# Patient Record
Sex: Male | Born: 1970 | Race: Black or African American | Hispanic: No | Marital: Married | Smoking: Never smoker
Health system: Southern US, Community
[De-identification: ages and names within clinical notes are randomized; demographics above are authoritative.]

## PROBLEM LIST (undated history)

## (undated) HISTORY — PX: KNEE SURGERY: SHX244

---

## 2008-08-23 ENCOUNTER — Emergency Department: Payer: Self-pay | Admitting: Emergency Medicine

## 2013-09-18 ENCOUNTER — Emergency Department: Payer: Self-pay | Admitting: Emergency Medicine

## 2013-09-18 LAB — CBC WITH DIFFERENTIAL/PLATELET
BASOS PCT: 0.4 %
Basophil #: 0 10*3/uL (ref 0.0–0.1)
Eosinophil #: 0.2 10*3/uL (ref 0.0–0.7)
Eosinophil %: 1.7 %
HCT: 43.4 % (ref 40.0–52.0)
HGB: 14.1 g/dL (ref 13.0–18.0)
Lymphocyte #: 2.8 10*3/uL (ref 1.0–3.6)
Lymphocyte %: 21.7 %
MCH: 28 pg (ref 26.0–34.0)
MCHC: 32.4 g/dL (ref 32.0–36.0)
MCV: 86 fL (ref 80–100)
MONO ABS: 0.7 x10 3/mm (ref 0.2–1.0)
Monocyte %: 5.2 %
NEUTROS ABS: 9.2 10*3/uL — AB (ref 1.4–6.5)
Neutrophil %: 71 %
Platelet: 211 10*3/uL (ref 150–440)
RBC: 5.04 10*6/uL (ref 4.40–5.90)
RDW: 14.2 % (ref 11.5–14.5)
WBC: 13 10*3/uL — AB (ref 3.8–10.6)

## 2013-09-18 LAB — COMPREHENSIVE METABOLIC PANEL
ALBUMIN: 3.8 g/dL (ref 3.4–5.0)
ALT: 33 U/L (ref 12–78)
ANION GAP: 9 (ref 7–16)
AST: 22 U/L (ref 15–37)
Alkaline Phosphatase: 65 U/L
BUN: 17 mg/dL (ref 7–18)
Bilirubin,Total: 0.3 mg/dL (ref 0.2–1.0)
CALCIUM: 8.9 mg/dL (ref 8.5–10.1)
Chloride: 105 mmol/L (ref 98–107)
Co2: 24 mmol/L (ref 21–32)
Creatinine: 1.27 mg/dL (ref 0.60–1.30)
GLUCOSE: 141 mg/dL — AB (ref 65–99)
Osmolality: 280 (ref 275–301)
Potassium: 3.9 mmol/L (ref 3.5–5.1)
Sodium: 138 mmol/L (ref 136–145)
TOTAL PROTEIN: 8.1 g/dL (ref 6.4–8.2)

## 2013-09-18 LAB — LIPASE, BLOOD: LIPASE: 237 U/L (ref 73–393)

## 2013-09-19 LAB — URINALYSIS, COMPLETE
Bilirubin,UR: NEGATIVE
Blood: NEGATIVE
GLUCOSE, UR: NEGATIVE mg/dL (ref 0–75)
KETONE: NEGATIVE
Leukocyte Esterase: NEGATIVE
Nitrite: NEGATIVE
Ph: 7 (ref 4.5–8.0)
Protein: NEGATIVE
RBC,UR: 3 /HPF (ref 0–5)
Specific Gravity: 1.024 (ref 1.003–1.030)

## 2014-12-07 IMAGING — CT CT STONE STUDY
2 of 4 series · 15 of 46 positions shown, 17 images · non-contrast
Comparison: None.

CLINICAL DATA: Severe left lower quadrant abdominal pain, radiating
to the left side of the back.

EXAM:
CT ABDOMEN AND PELVIS WITHOUT CONTRAST
TECHNIQUE: Multidetector CT imaging of the abdomen and pelvis was performed
following the standard protocol without IV contrast.

[Series 2: stone standard full · axial · 0.80mm/px · z∈[-412,+58]mm · 12 of 104 slices shown, 14 images]
[im 5/104  soft-tissue]
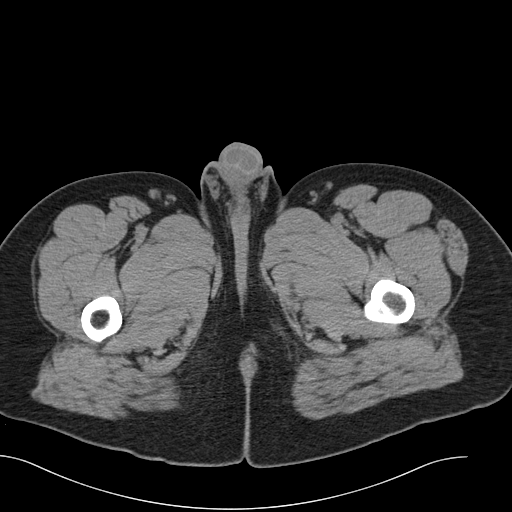
[im 5/104  bone]
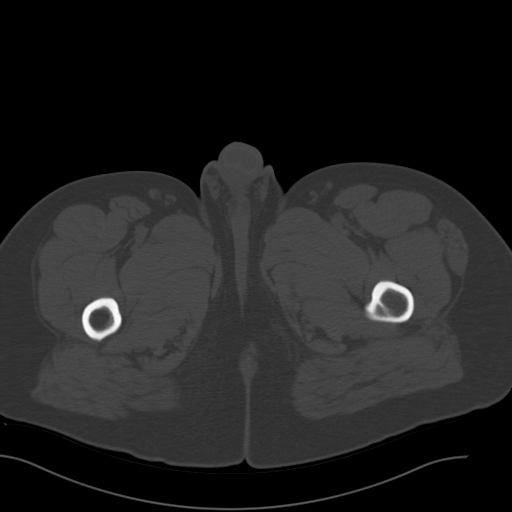
[im 13/104  soft-tissue]
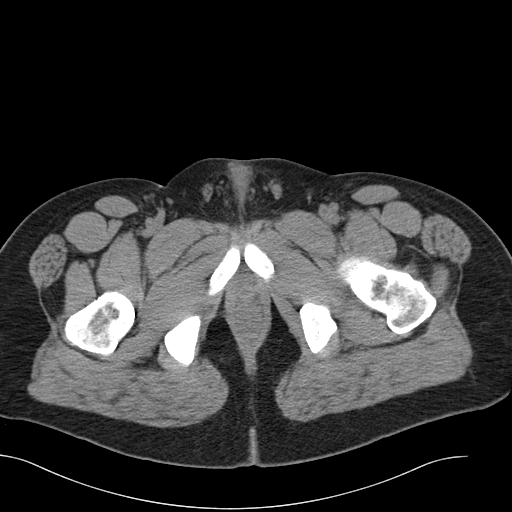
[im 22/104  soft-tissue]
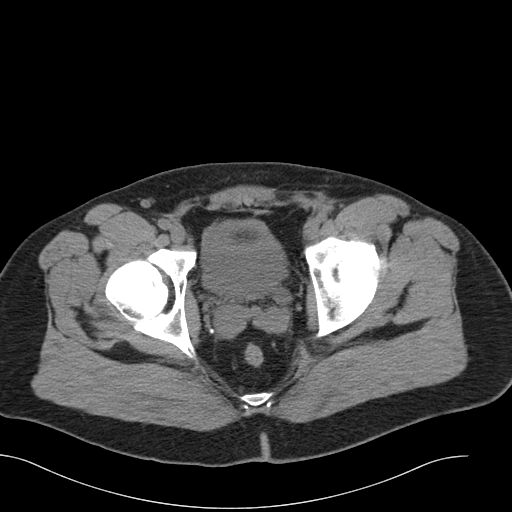
[im 31/104  soft-tissue]
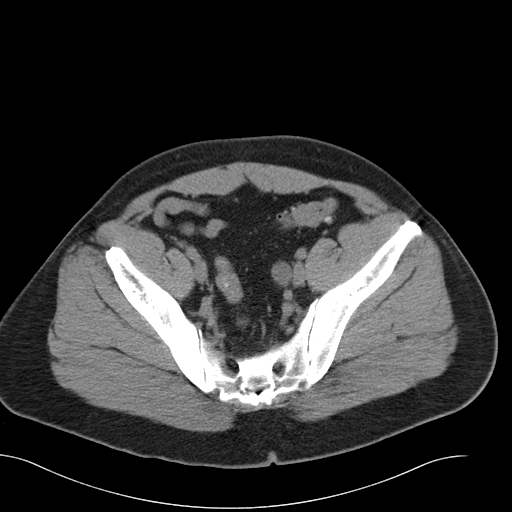
[im 39/104  soft-tissue]
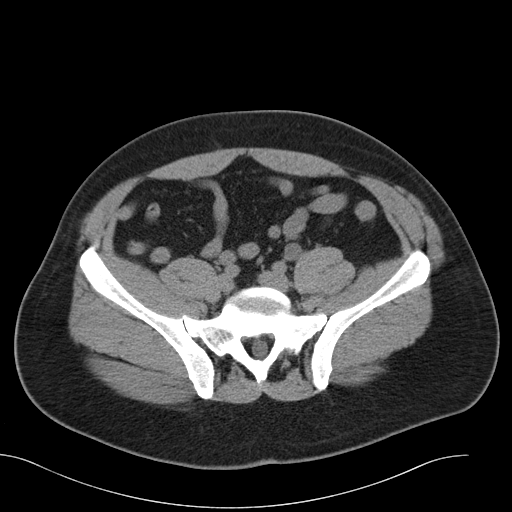
[im 48/104  soft-tissue]
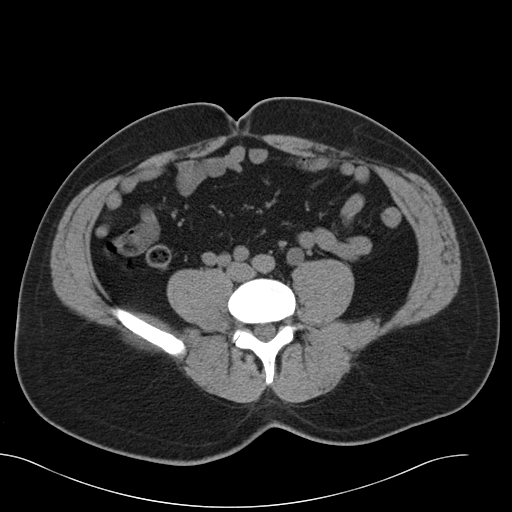
[im 56/104  soft-tissue]
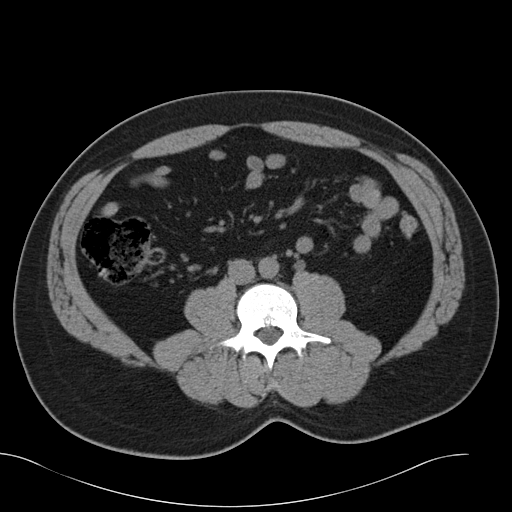
[im 65/104  soft-tissue]
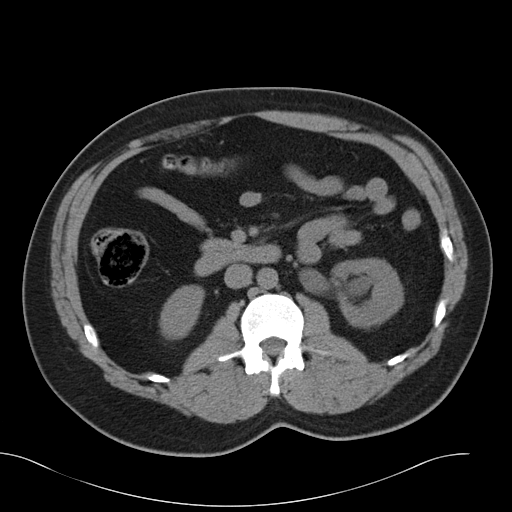
[im 73/104  soft-tissue]
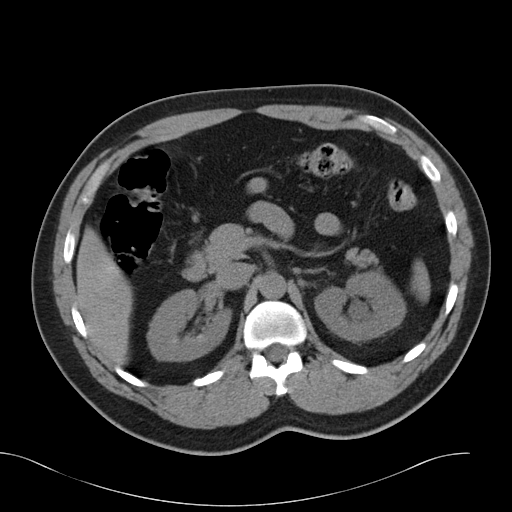
[im 73/104  bone]
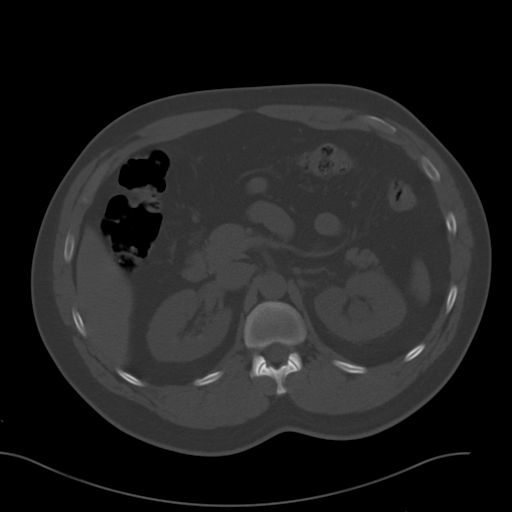
[im 82/104  soft-tissue]
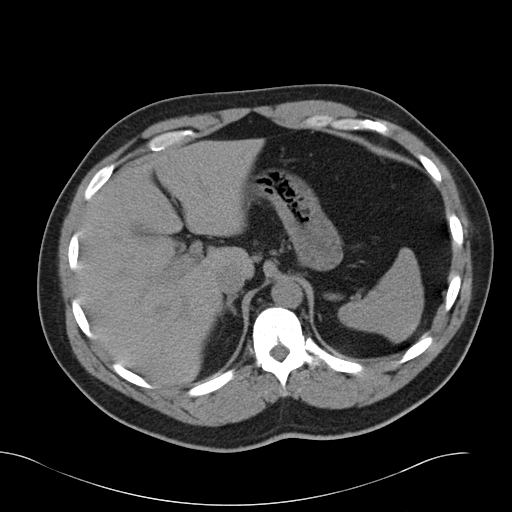
[im 91/104  soft-tissue]
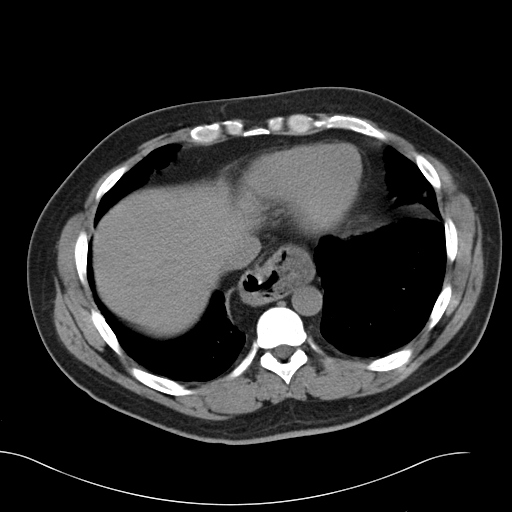
[im 99/104  soft-tissue]
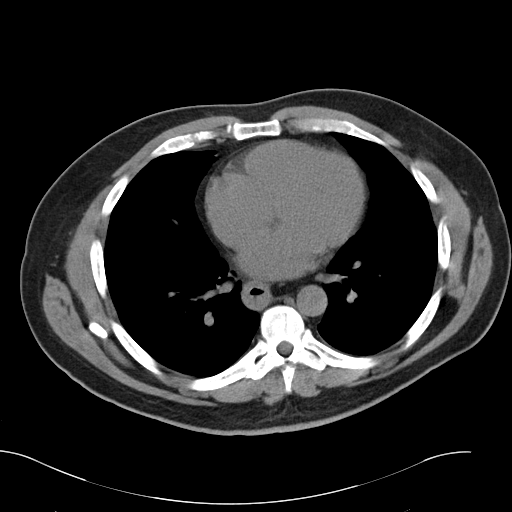

[Series 5: cor stone standard full · coronal · 0.77mm/px · 3 of 134 slices shown]
[im 45/134  soft-tissue]
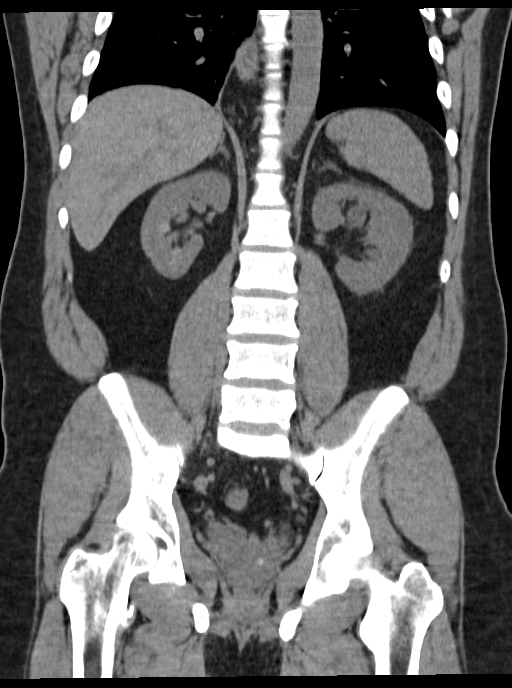
[im 60/134  soft-tissue]
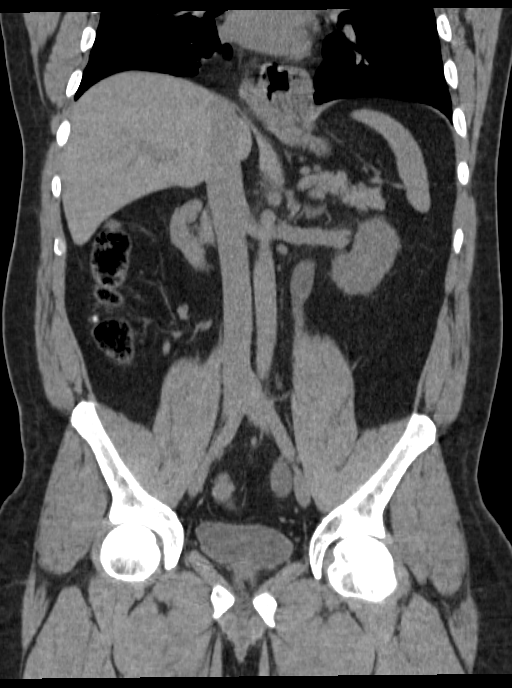
[im 74/134  soft-tissue]
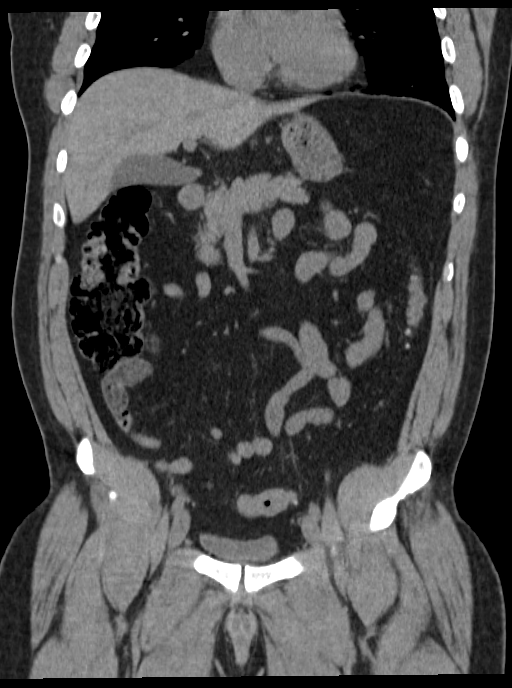

[15 of 46 positions shown; findings below may reference images not displayed]

FINDINGS: The visualized lung bases are clear. Thickening of the distal
esophagus may reflect chronic sequelae of inflammatory change or
possibly mild esophagitis.

The liver and spleen are unremarkable in appearance. The gallbladder
is within normal limits. The pancreas and adrenal glands are
unremarkable.

There is moderate left-sided hydronephrosis, with diffuse prominence
of the left ureter. However, no distal obstructing stone is
identified. This may reflect a recently passed stone, not visualized
on this study.

Minimal calcification is noted at the right renal calyces, without a
defined stone. The right kidney is otherwise unremarkable. Mild
perinephric stranding is noted on the left side.

No free fluid is identified. The small bowel is unremarkable in
appearance. A small to moderate hiatal hernia is seen. The stomach
is otherwise unremarkable in appearance. No acute vascular
abnormalities are seen.

The appendix distended state and grossly unremarkable in appearance,
without evidence for appendicitis. Scattered diverticulosis is noted
along the descending and proximal sigmoid colon, without evidence of
diverticulitis.

The bladder is largely decompressed and grossly unremarkable. A
small urachal remnant is incidentally noted. Postoperative change is
noted at the left inguinal region, reflecting prior inguinal hernia
repair. No inguinal lymphadenopathy is seen.

No acute osseous abnormalities are identified.
IMPRESSION: 1. Moderate left-sided hydronephrosis, with diffuse left ureteral
prominence. However, no distal obstructing stone is seen. This may
reflect a recently passed stone, not visualized on this study.
2. Small to moderate hiatal hernia seen.
3. Thickening of the distal esophagus may reflect chronic sequelae
of inflammatory change or possibly mild esophagitis.
4. Scattered diverticulosis along the descending and proximal
sigmoid colon, without evidence of diverticulitis.

## 2015-06-20 ENCOUNTER — Encounter: Payer: Self-pay | Admitting: Emergency Medicine

## 2015-06-20 ENCOUNTER — Emergency Department
Admission: EM | Admit: 2015-06-20 | Discharge: 2015-06-20 | Disposition: A | Discharge status: Home / Self Care | Payer: Self-pay | Attending: Emergency Medicine | Admitting: Emergency Medicine

## 2015-06-20 DIAGNOSIS — R21 Rash and other nonspecific skin eruption: Secondary | ICD-10-CM | POA: Insufficient documentation

## 2015-06-20 MED ORDER — PREDNISONE 10 MG (21) PO TBPK: ORAL_TABLET | ORAL | Status: DC

## 2015-06-20 MED ORDER — METHYLPREDNISOLONE SODIUM SUCC 125 MG IJ SOLR
125.0000 mg | Freq: Once | INTRAMUSCULAR | Status: AC
  Administered 2015-06-20: 125 mg via INTRAMUSCULAR
  Filled 2015-06-20: qty 2

## 2015-06-20 MED ORDER — PERMETHRIN 5 % EX CREA: 1.0000 "application " | TOPICAL_CREAM | Freq: Once | CUTANEOUS | Status: DC

## 2015-06-20 NOTE — ED Provider Notes (Signed)
[facility] Emergency Department Provider Note     Time seen: ----------------------------------------- 10:07 PM on 06/20/2015 -----------------------------------------    I have reviewed the triage vital signs and the nursing notes.   HISTORY  Chief Complaint No chief complaint on file.    HPI Edwin Vincent is a 45 y.o. male who presents to ER for intense pruritic rash over his trunk and extremities for the last month. Patient denies any contact with anyone else that has had a rash although he works as a Paediatric nursebarber, denies any fevers chills or other complaints.   No past medical history on file.  There are no active problems to display for this patient.   No past surgical history on file.  Allergies Review of patient's allergies indicates not on file.  Social History Social History  Substance Use Topics  . Smoking status: Not on file  . Smokeless tobacco: Not on file  . Alcohol Use: Not on file    Review of Systems Constitutional: Negative for fever. Skin: Positive for rash Neurological: Negative for headaches, focal weakness or numbness.  10-point ROS otherwise negative.  ____________________________________________   PHYSICAL EXAM:  VITAL SIGNS: ED Triage Vitals  Enc Vitals Group     BP 06/20/15 2156 125/80 mmHg     Pulse Rate 06/20/15 2156 71     Resp 06/20/15 2156 18     Temp 06/20/15 2156 98.6 F (37 C)     Temp Source 06/20/15 2156 Oral     SpO2 06/20/15 2156 98 %     Weight --      Height 06/20/15 2156 5\' 8"  (1.727 m)     Head Cir --      Peak Flow --      Pain Score --      Pain Loc --      Pain Edu? --      Excl. in GC? --     Constitutional: Alert and oriented. Well appearing and in no distress. Eyes: Conjunctivae are normal. PERRL. Normal extraocular movements. ENT   Head: Normocephalic and atraumatic.   Nose: No congestion/rhinnorhea.   Mouth/Throat: Mucous membranes are moist.   Neck: No  stridor. Musculoskeletal: Nontender with normal range of motion in all extremities. Multiple areas of rash with excoriation as dictated below Neurologic:  Normal speech and language. No gross focal neurologic deficits are appreciated.  Skin:  Extensive papular lesions, some of which are grouped mostly over the upper and lower extremities, some on the trunk as well. Multiple areas of excoriation, rash likely consistent with scabies. Psychiatric: Mood and affect are normal. Speech and behavior are normal.  ___________________________________________  ED COURSE:  Pertinent labs & imaging results that were available during my care of the patient were reviewed by me and considered in my medical decision making (see chart for details). Patient is in no acute distress, rash likely consistent with scabies. He has received IM Solu-Medrol. ___________________________________________  FINAL ASSESSMENT AND PLAN  Skin rash  Plan: Patient with an intense pruritic rash of uncertain etiology. I placed him on Solu-Medrol to try to suppress his dermatitis, he'll be on Elamite cream and is encouraged to follow up with dermatology in a week if not improved.  Emily FilbertWilliams, Jonathan E, MD   Emily FilbertJonathan E Williams, MD 06/20/15 [phone]

## 2015-06-20 NOTE — ED Notes (Signed)
Pt c/o rash all over body x1 month ago. Pt reports itching and redness to the areas.

## 2015-06-20 NOTE — Discharge Instructions (Signed)
Scabies, Adult Scabies is a skin condition that happens when very small insects get under the skin (infestation). This causes a rash and severe itchiness. Scabies can spread from person to person (is contagious). If you get scabies, it is common for others in your household to get scabies too. With proper treatment, symptoms usually go away in 2-4 weeks. Scabies usually does not cause lasting problems. CAUSES This condition is caused by mites (Sarcoptes scabiei, or human itch mites) that can only be seen with a microscope. The mites get into the top layer of skin and lay eggs. Scabies can spread from person to person through:  Close contact with a person who has scabies.  Contact with infested items, such as towels, bedding, or clothing. RISK FACTORS This condition is more likely to develop in:  People who live in nursing homes and other extended-care facilities.  People who have sexual contact with a partner who has scabies.  Young children who attend child care facilities.  People who care for others who are at increased risk for scabies. SYMPTOMS Symptoms of this condition may include:  Severe itchiness. This is often worse at night.  A rash that includes tiny red bumps or blisters. The rash commonly occurs on the wrist, elbow, armpit, fingers, waist, groin, or buttocks. Bumps may form a line (burrow) in some areas.  Skin irritation. This can include scaly patches or sores. DIAGNOSIS This condition is diagnosed with a physical exam. Your health care provider will look closely at your skin. In some cases, your health care provider may take a sample of your affected skin (skin scraping) and have it examined under a microscope. TREATMENT This condition may be treated with:  Medicated cream or lotion that kills the mites. This is spread on the entire body and left on for several hours. Usually, one treatment with medicated cream or lotion is enough to kill all of the mites. In severe  cases, the treatment may be repeated.  Medicated cream that relieves itching.  Medicines that help to relieve itching.  Medicines that kill the mites. This treatment is rarely used. HOME CARE INSTRUCTIONS Medicines  Take or apply over-the-counter and prescription medicines as told by your health care provider.  Apply medicated cream or lotion as told by your health care provider.  Do not wash off the medicated cream or lotion until the necessary amount of time has passed. Skin Care  Avoid scratching your affected skin.  Keep your fingernails closely trimmed to reduce injury from scratching.  Take cool baths or apply cool washcloths to help reduce itching. General Instructions  Clean all items that you recently had contact with, including bedding, clothing, and furniture. Do this on the same day that your treatment starts.  Use hot water when you wash items.  Place unwashable items into closed, airtight plastic bags for at least 3 days. The mites cannot live for more than 3 days away from human skin.  Vacuum furniture and mattresses that you use.  Make sure that other people who may have been infested are examined by a health care provider. These include members of your household and anyone who may have had contact with infested items.  Keep all follow-up visits as told by your health care provider. This is important. SEEK MEDICAL CARE IF:  You have itching that does not go away after 4 weeks of treatment.  You continue to develop new bumps or burrows.  You have redness, swelling, or pain in your rash area  after treatment.  You have fluid, blood, or pus coming from your rash.   This information is not intended to replace advice given to you by your health care provider. Make sure you discuss any questions you have with your health care provider.   Document Released: 11/07/2014 Document Reviewed: 09/18/2014 Elsevier Interactive Patient Education 2016 [employer] Rash A rash is a change in the color or texture of the skin. There are many different types of rashes. You may have other problems that accompany your rash. CAUSES   Infections.  Allergic reactions. This can include allergies to pets or foods.  Certain medicines.  Exposure to certain chemicals, soaps, or cosmetics.  Heat.  Exposure to poisonous plants.  Tumors, both cancerous and noncancerous. SYMPTOMS   Redness.  Scaly skin.  Itchy skin.  Dry or cracked skin.  Bumps.  Blisters.  Pain. DIAGNOSIS  Your caregiver may do a physical exam to determine what type of rash you have. A skin sample (biopsy) may be taken and examined under a microscope. TREATMENT  Treatment depends on the type of rash you have. Your caregiver may prescribe certain medicines. For serious conditions, you may need to see a skin doctor (dermatologist). HOME CARE INSTRUCTIONS   Avoid the substance that caused your rash.  Do not scratch your rash. This can cause infection.  You may take cool baths to help stop itching.  Only take over-the-counter or prescription medicines as directed by your caregiver.  Keep all follow-up appointments as directed by your caregiver. SEEK IMMEDIATE MEDICAL CARE IF:  You have increasing pain, swelling, or redness.  You have a fever.  You have new or severe symptoms.  You have body aches, diarrhea, or vomiting.  Your rash is not better after 3 days. MAKE SURE YOU:  Understand these instructions.  Will watch your condition.  Will get help right away if you are not doing well or get worse.   This information is not intended to replace advice given to you by your health care provider. Make sure you discuss any questions you have with your health care provider.   Document Released: 02/06/2002 Document Revised: 03/09/2014 Document Reviewed: 07/04/2014 Elsevier Interactive Patient Education [employer] Elsevier Inc.

## 2015-07-10 ENCOUNTER — Emergency Department
Admission: EM | Admit: 2015-07-10 | Discharge: 2015-07-10 | Disposition: A | Discharge status: Home / Self Care | Payer: Self-pay | Attending: Emergency Medicine | Admitting: Emergency Medicine

## 2015-07-10 ENCOUNTER — Encounter: Payer: Self-pay | Admitting: *Deleted

## 2015-07-10 DIAGNOSIS — L209 Atopic dermatitis, unspecified: Secondary | ICD-10-CM | POA: Insufficient documentation

## 2015-07-10 LAB — CBC WITH DIFFERENTIAL/PLATELET
Basophils Absolute: 0 10*3/uL (ref 0–0.1)
Basophils Relative: 1 %
Eosinophils Absolute: 0.5 10*3/uL (ref 0–0.7)
Eosinophils Relative: 6 %
HCT: 41.7 % (ref 40.0–52.0)
HEMOGLOBIN: 13.7 g/dL (ref 13.0–18.0)
LYMPHS ABS: 2.3 10*3/uL (ref 1.0–3.6)
LYMPHS PCT: 28 %
MCH: 27.6 pg (ref 26.0–34.0)
MCHC: 32.8 g/dL (ref 32.0–36.0)
MCV: 84 fL (ref 80.0–100.0)
Monocytes Absolute: 0.6 10*3/uL (ref 0.2–1.0)
Monocytes Relative: 8 %
NEUTROS ABS: 4.8 10*3/uL (ref 1.4–6.5)
NEUTROS PCT: 57 %
Platelets: 190 10*3/uL (ref 150–440)
RBC: 4.96 MIL/uL (ref 4.40–5.90)
RDW: 14.4 % (ref 11.5–14.5)
WBC: 8.3 10*3/uL (ref 3.8–10.6)

## 2015-07-10 LAB — COMPREHENSIVE METABOLIC PANEL
ALK PHOS: 67 U/L (ref 38–126)
ALT: 29 U/L (ref 17–63)
AST: 32 U/L (ref 15–41)
Albumin: 4 g/dL (ref 3.5–5.0)
Anion gap: 7 (ref 5–15)
BUN: 13 mg/dL (ref 6–20)
CALCIUM: 8.7 mg/dL — AB (ref 8.9–10.3)
CO2: 24 mmol/L (ref 22–32)
CREATININE: 1.02 mg/dL (ref 0.61–1.24)
Chloride: 108 mmol/L (ref 101–111)
Glucose, Bld: 115 mg/dL — ABNORMAL HIGH (ref 65–99)
Potassium: 3.9 mmol/L (ref 3.5–5.1)
Sodium: 139 mmol/L (ref 135–145)
Total Bilirubin: 0.4 mg/dL (ref 0.3–1.2)
Total Protein: 7.8 g/dL (ref 6.5–8.1)

## 2015-07-10 MED ORDER — HYDROXYZINE PAMOATE 25 MG PO CAPS: 25.0000 mg | ORAL_CAPSULE | Freq: Three times a day (TID) | ORAL | Status: DC | PRN

## 2015-07-10 MED ORDER — PREDNISONE 10 MG PO TABS: 50.0000 mg | ORAL_TABLET | Freq: Every day | ORAL | Status: DC

## 2015-07-10 NOTE — ED Notes (Signed)
Pt repots he has itching rash all over body.  Pt seen in er 3 weeks ago and was dx with scabies.  Pt reports taking meds and ointment without relief.  Rash is worse.  No resp distress. Pt alert.

## 2015-07-10 NOTE — Discharge Instructions (Signed)
Eczema Eczema, also called atopic dermatitis, is a skin disorder that causes inflammation of the skin. It causes a red rash and dry, scaly skin. The skin becomes very itchy. Eczema is generally worse during the cooler winter months and often improves with the warmth of summer. Eczema usually starts showing signs in infancy. Some children outgrow eczema, but it may last through adulthood.  CAUSES  The exact cause of eczema is not known, but it appears to run in families. People with eczema often have a family history of eczema, allergies, asthma, or hay fever. Eczema is not contagious. Flare-ups of the condition may be caused by:   Contact with something you are sensitive or allergic to.   Stress. SIGNS AND SYMPTOMS  Dry, scaly skin.   Red, itchy rash.   Itchiness. This may occur before the skin rash and may be very intense.  DIAGNOSIS  The diagnosis of eczema is usually made based on symptoms and medical history. TREATMENT  Eczema cannot be cured, but symptoms usually can be controlled with treatment and other strategies. A treatment plan might include:  Controlling the itching and scratching.   Use over-the-counter antihistamines as directed for itching. This is especially useful at night when the itching tends to be worse.   Use over-the-counter steroid creams as directed for itching.   Avoid scratching. Scratching makes the rash and itching worse. It may also result in a skin infection (impetigo) due to a break in the skin caused by scratching.   Keeping the skin well moisturized with creams every day. This will seal in moisture and help prevent dryness. Lotions that contain alcohol and water should be avoided because they can dry the skin.   Limiting exposure to things that you are sensitive or allergic to (allergens).   Recognizing situations that cause stress.   Developing a plan to manage stress.  HOME CARE INSTRUCTIONS   Only take over-the-counter or  prescription medicines as directed by your health care provider.   Do not use anything on the skin without checking with your health care provider.   Keep baths or showers short (5 minutes) in warm (not hot) water. Use mild cleansers for bathing. These should be unscented. You may add nonperfumed bath oil to the bath water. It is best to avoid soap and bubble bath.   Immediately after a bath or shower, when the skin is still damp, apply a moisturizing ointment to the entire body. This ointment should be a petroleum ointment. This will seal in moisture and help prevent dryness. The thicker the ointment, the better. These should be unscented.   Keep fingernails cut short. Children with eczema may need to wear soft gloves or mittens at night after applying an ointment.   Dress in clothes made of cotton or cotton blends. Dress lightly, because heat increases itching.   A child with eczema should stay away from anyone with fever blisters or cold sores. The virus that causes fever blisters (herpes simplex) can cause a serious skin infection in children with eczema. SEEK MEDICAL CARE IF:   Your itching interferes with sleep.   Your rash gets worse or is not better within 1 week after starting treatment.   You see pus or soft yellow scabs in the rash area.   You have a fever.   You have a rash flare-up after contact with someone who has fever blisters.    This information is not intended to replace advice given to you by your health care  provider. Make sure you discuss any questions you have with your health care provider. °  °Document Released: 02/14/2000 Document Revised: 12/07/2012 Document Reviewed: 09/19/2012 °Elsevier Interactive Patient Education ©2016 Elsevier Inc. °Rash °A rash is a change in the color or texture of the skin. There are many different types of rashes. You may have other problems that accompany your rash. °CAUSES  °· Infections. °· Allergic reactions. This can  include allergies to pets or foods. °· Certain medicines. °· Exposure to certain chemicals, soaps, or cosmetics. °· Heat. °· Exposure to poisonous plants. °· Tumors, both cancerous and noncancerous. °SYMPTOMS  °· Redness. °· Scaly skin. °· Itchy skin. °· Dry or cracked skin. °· Bumps. °· Blisters. °· Pain. °DIAGNOSIS  °Your caregiver may do a physical exam to determine what type of rash you have. A skin sample (biopsy) may be taken and examined under a microscope. °TREATMENT  °Treatment depends on the type of rash you have. Your caregiver may prescribe certain medicines. For serious conditions, you may need to see a skin doctor (dermatologist). °HOME CARE INSTRUCTIONS  °· Avoid the substance that caused your rash. °· Do not scratch your rash. This can cause infection. °· You may take cool baths to help stop itching. °· Only take over-the-counter or prescription medicines as directed by your caregiver. °· Keep all follow-up appointments as directed by your caregiver. °SEEK IMMEDIATE MEDICAL CARE IF: °· You have increasing pain, swelling, or redness. °· You have a fever. °· You have new or severe symptoms. °· You have body aches, diarrhea, or vomiting. °· Your rash is not better after 3 days. °MAKE SURE YOU: °· Understand these instructions. °· Will watch your condition. °· Will get help right away if you are not doing well or get worse. °  °This information is not intended to replace advice given to you by your health care provider. Make sure you discuss any questions you have with your health care provider. °  °Document Released: 02/06/2002 Document Revised: 03/09/2014 Document Reviewed: 07/04/2014 °Elsevier Interactive Patient Education ©2016 Elsevier Inc. ° °

## 2015-07-10 NOTE — ED Provider Notes (Signed)
[facility] Emergency Department Provider Note  ____________________________________________  Time seen: Approximately 7:34 PM  I have reviewed the triage vital signs and the nursing notes.   HISTORY  Chief Complaint Rash    HPI Edwin Vincent is a 45 y.o. male who was seen here 3 weeks ago for the same. Patient complains he got a about a 6 week almost 2 month history of a rash all over his body. Patient reports that extremely itchy and feels like it's getting worse since the steroid shot and the use of Elimite cream. Did not follow-up with dermatology as requested.   No past medical history on file.  There are no active problems to display for this patient.   No past surgical history on file.  Current Outpatient Rx  Name  Route  Sig  Dispense  Refill  . hydrOXYzine (VISTARIL) 25 MG capsule   Oral   Take 1 capsule (25 mg total) by mouth 3 (three) times daily as needed.   30 capsule   0   . permethrin (ELIMITE) 5 % cream   Topical   Apply 1 application topically once.   60 g   1   . predniSONE (DELTASONE) 10 MG tablet   Oral   Take 5 tablets (50 mg total) by mouth daily with breakfast.   25 tablet   0     Allergies Review of patient's allergies indicates no known allergies.  No family history on file.  Social History Social History  Substance Use Topics  . Smoking status: Never Smoker   . Smokeless tobacco: None  . Alcohol Use: No    Review of Systems Constitutional: No fever/chills Skin: Positive for rash. Neurological: Negative for headaches, focal weakness or numbness.  10-point ROS otherwise negative.  ____________________________________________   PHYSICAL EXAM:  VITAL SIGNS: ED Triage Vitals  Enc Vitals Group     BP 07/10/15 1838 127/87 mmHg     Pulse Rate 07/10/15 1836 80     Resp 07/10/15 1836 18     Temp 07/10/15 1836 98.6 F (37 C)     Temp Source 07/10/15 1836 Oral     SpO2 07/10/15 1836 97 %   Weight 07/10/15 1836 215 lb (97.523 kg)     Height 07/10/15 1836 5\' 8"  (1.727 m)     Head Cir --      Peak Flow --      Pain Score 07/10/15 1922 0     Pain Loc --      Pain Edu? --      Excl. in GC? --     Constitutional: Alert and oriented. Well appearing and in no acute distress. Neurologic:  Normal speech and language. No gross focal neurologic deficits are appreciated. No gait instability. Skin:  Extensive lesions noted throughout the entire body on the trunk front and back both upper arms both legs. Multiple areas of excoriation and tracking consistent with scabies. Psychiatric: Mood and affect are normal. Speech and behavior are normal.  ____________________________________________   LABS (all labs ordered are listed, but only abnormal results are displayed)  Labs Reviewed  COMPREHENSIVE METABOLIC PANEL - Abnormal; Notable for the following:    Glucose, Bld 115 (*)    Calcium 8.7 (*)    All other components within normal limits  CBC WITH DIFFERENTIAL/PLATELET   ____________________________________________  EKG   ____________________________________________  RADIOLOGY   ____________________________________________   PROCEDURES  Procedure(s) performed: None  Critical Care performed: No  ____________________________________________  INITIAL IMPRESSION / ASSESSMENT AND PLAN / ED COURSE  Pertinent labs & imaging results that were available during my care of the patient were reviewed by me and considered in my medical decision making (see chart for details).  Acute atopic dermatitis versus scabies versus eczema . Discussed all clinical findings with Dr. Mayford Knife in ingredients. Patient will be started on a 5 day prednisone dose. Hydroxyzine given for itching and encouraged highly to follow up with dermatology. ____________________________________________   FINAL CLINICAL IMPRESSION(S) / ED DIAGNOSES  Final diagnoses:  Dermatitis, atopic     This chart  was dictated using voice recognition software/Dragon. Despite best efforts to proofread, errors can occur which can change the meaning. Any change was purely unintentional.   Evangeline Dakin, PA-C 07/10/15 0981  Maurilio Lovely, MD 07/11/15 1914

## 2015-07-10 NOTE — ED Notes (Signed)
Pt reports that he has a rash all over and has been treated for scabies with no relief - per pt he has had rash for months and no one else in his household has a rash

## 2018-03-06 ENCOUNTER — Emergency Department
Admission: EM | Admit: 2018-03-06 | Discharge: 2018-03-06 | Disposition: A | Discharge status: Home / Self Care | Payer: No Typology Code available for payment source | Attending: Emergency Medicine | Admitting: Emergency Medicine

## 2018-03-06 ENCOUNTER — Emergency Department: Payer: No Typology Code available for payment source

## 2018-03-06 ENCOUNTER — Other Ambulatory Visit: Payer: Self-pay

## 2018-03-06 DIAGNOSIS — Y929 Unspecified place or not applicable: Secondary | ICD-10-CM | POA: Insufficient documentation

## 2018-03-06 DIAGNOSIS — Y939 Activity, unspecified: Secondary | ICD-10-CM | POA: Insufficient documentation

## 2018-03-06 DIAGNOSIS — Y999 Unspecified external cause status: Secondary | ICD-10-CM | POA: Insufficient documentation

## 2018-03-06 DIAGNOSIS — S199XXA Unspecified injury of neck, initial encounter: Secondary | ICD-10-CM | POA: Diagnosis present

## 2018-03-06 DIAGNOSIS — S161XXA Strain of muscle, fascia and tendon at neck level, initial encounter: Secondary | ICD-10-CM | POA: Insufficient documentation

## 2018-03-06 DIAGNOSIS — S8001XA Contusion of right knee, initial encounter: Secondary | ICD-10-CM

## 2018-03-06 MED ORDER — MELOXICAM 15 MG PO TABS: 15.0000 mg | ORAL_TABLET | Freq: Every day | ORAL | 2 refills | Status: AC

## 2018-03-06 MED ORDER — BACLOFEN 10 MG PO TABS: 10.0000 mg | ORAL_TABLET | Freq: Three times a day (TID) | ORAL | 1 refills | Status: AC

## 2018-03-06 NOTE — Discharge Instructions (Addendum)
Follow-up with your regular doctor or [facility] clinic orthopedics.  Please call for an appointment as needed.  Take the medication as needed for pain and muscle strain.  Apply ice to all areas that hurt for the next 3 days.  After that please use wet heat followed by ice.  You may need physical therapy if you are not better in 5 to 7 days.  Please call [facility] clinic for an appointment.
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
## 2022-01-27 ENCOUNTER — Encounter: Payer: Self-pay | Admitting: Internal Medicine

## 2022-01-27 ENCOUNTER — Ambulatory Visit (INDEPENDENT_AMBULATORY_CARE_PROVIDER_SITE_OTHER): Payer: 59 | Admitting: Internal Medicine

## 2022-01-27 VITALS — BP 122/80 | HR 67 | Temp 97.8°F | Resp 16 | Ht 69.0 in | Wt 257.9 lb

## 2022-01-27 DIAGNOSIS — Z13220 Encounter for screening for lipoid disorders: Secondary | ICD-10-CM

## 2022-01-27 DIAGNOSIS — Z114 Encounter for screening for human immunodeficiency virus [HIV]: Secondary | ICD-10-CM | POA: Diagnosis not present

## 2022-01-27 DIAGNOSIS — Z0000 Encounter for general adult medical examination without abnormal findings: Secondary | ICD-10-CM | POA: Diagnosis not present

## 2022-01-27 DIAGNOSIS — Z23 Encounter for immunization: Secondary | ICD-10-CM | POA: Diagnosis not present

## 2022-01-27 DIAGNOSIS — Z1159 Encounter for screening for other viral diseases: Secondary | ICD-10-CM | POA: Diagnosis not present

## 2022-01-27 DIAGNOSIS — R7309 Other abnormal glucose: Secondary | ICD-10-CM | POA: Diagnosis not present

## 2022-01-27 DIAGNOSIS — R69 Illness, unspecified: Secondary | ICD-10-CM | POA: Diagnosis not present

## 2022-01-27 DIAGNOSIS — Z125 Encounter for screening for malignant neoplasm of prostate: Secondary | ICD-10-CM

## 2022-01-27 DIAGNOSIS — Z1211 Encounter for screening for malignant neoplasm of colon: Secondary | ICD-10-CM | POA: Diagnosis not present

## 2022-01-27 LAB — CBC WITH DIFFERENTIAL/PLATELET
Absolute Monocytes: 442 cells/uL (ref 200–950)
Eosinophils Absolute: 350 cells/uL (ref 15–500)
Eosinophils Relative: 5.3 %
HCT: 42.5 % (ref 38.5–50.0)
Hemoglobin: 14.2 g/dL (ref 13.2–17.1)
Lymphs Abs: 2369 cells/uL (ref 850–3900)
MCH: 28.3 pg (ref 27.0–33.0)
MCHC: 33.4 g/dL (ref 32.0–36.0)
MCV: 84.8 fL (ref 80.0–100.0)
Monocytes Relative: 6.7 %
Neutro Abs: 3366 cells/uL (ref 1500–7800)
Neutrophils Relative %: 51 %
RBC: 5.01 10*6/uL (ref 4.20–5.80)
RDW: 13.1 % (ref 11.0–15.0)
Total Lymphocyte: 35.9 %
WBC: 6.6 10*3/uL (ref 3.8–10.8)

## 2022-01-27 NOTE — Patient Instructions (Signed)
It was great seeing you today!  Plan discussed at today's visit: -Blood work ordered today, results will be uploaded to MyChart.  -Tdap vaccine today -Referral for colonoscopy placed  Follow up in: 1 year or sooner as needed for knees  Take care and let us know if you have any questions or concerns prior to your next visit.  Dr. Caralee Ates

## 2022-01-27 NOTE — Progress Notes (Signed)
Name: Edwin Vincent   MRN: [id number]    DOB: [birth year]   Date:01/27/2022       Progress Note  Subjective  Chief Complaint  Chief Complaint  Patient presents with   Establish Care    HPI  Patient presents to establish care and for CPE.  He has never been diagnosed with any chronic medical conditions and takes no daily medications.  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  IPSS Questionnaire (AUA-7): Over the past month.   1)  How often have you had a sensation of not emptying your bladder completely after you finish urinating?  0 - Not at all  2)  How often have you had to urinate again less than two hours after you finished urinating? 1 - Less than 1 time in 5  3)  How often have you found you stopped and started again several times when you urinated?  1 - Less than 1 time in 5  4) How difficult have you found it to postpone urination?  0 - Not at all  5) How often have you had a weak urinary stream?  1 - Less than 1 time in 5  6) How often have you had to push or strain to begin urination?  0 - Not at all  7) How many times did you most typically get up to urinate from the time you went to bed until the time you got up in the morning?  1 - 1 time  Total score:  0-7 mildly symptomatic   8-19 moderately symptomatic   20-35 severely symptomatic    Diet: well rounded, likes veggies and drinking water, sometimes eats more red meat. Does eat out occasionally  Exercise: plays tennis, tries to go to the gym but has a hard time with his knees  Last Dental Exam: no dentist, discussed the importance of biannual dental cleanings Last Eye Exam: no eye doctor but patient wants to find an optometrist so he can get updated glasses  Depression: phq 9 is negative    01/27/2022    9:53 AM  Depression screen PHQ  2/9  Decreased Interest 0  Down, Depressed, Hopeless 0  PHQ - 2 Score 0  Altered sleeping 0  Tired, decreased energy 0  Change in appetite 0  Feeling bad or failure about yourself  0  Trouble concentrating 0  Moving slowly or fidgety/restless 0  Suicidal thoughts 0  PHQ-9 Score 0  Difficult doing work/chores Not difficult at all    Hypertension:  BP Readings from Last 3 Encounters:  01/27/22 122/80  03/06/18 116/80  07/10/15 127/87    Obesity: Wt Readings from Last 3 Encounters:  01/27/22 257 lb 14.4 oz (117 kg)  03/06/18 250 lb (113.4 kg)  07/10/15 215 lb (97.5 kg)   BMI Readings from Last 3 Encounters:  01/27/22 38.09 kg/m  03/06/18 38.01 kg/m  07/10/15 32.69 kg/m     Lipids:  No results found for: "CHOL" No results found for: "HDL" No results found for: "LDLCALC" No results found for: "TRIG" No results found for: "CHOLHDL" No results found for: "LDLDIRECT" Glucose:  Glucose  Date Value Ref Range Status  09/18/2013 141 (H) 65 - 99 mg/dL Final   Glucose, Bld  Date Value Ref Range Status  07/10/2015 115 (H)  65 - 99 mg/dL Final     Married STD testing and prevention (HIV/chl/gon/syphilis): No concerns  Hep C Screening: Due today Skin cancer: Discussed monitoring for atypical lesions Colorectal cancer: Due Prostate cancer:  yes No results found for: "PSA"  Lung cancer:  Low Dose CT Chest recommended if Age 38-80 years, 30 pack-year currently smoking OR have quit w/in 15years. Patient  no a candidate for screening   AAA: The USPSTF recommends one-time screening with ultrasonography in men ages 80 to 75 years who have ever smoked. Patient   no, a candidate for screening   Vaccines:   HPV: n/a Tdap: Due  Shingrix: n/a Pneumonia: n/a Flu: politely declines  COVID-19: first 2 vaccines   Advanced Care Planning: A voluntary discussion about advance care planning including the explanation and discussion of advance directives.  Discussed health care  proxy and Living will, and the patient was able to identify a health care proxy as wife Raquan Iannone.  Patient does not have a living will and power of attorney of health care   There are no problems to display for this patient.   Past Surgical History:  Procedure Laterality Date   KNEE SURGERY Bilateral     Family History  Problem Relation Age of Onset   Heart attack Father     Social History   Socioeconomic History   Marital status: Married    Spouse name: Not on file   Number of children: Not on file   Years of education: Not on file   Highest education level: Not on file  Occupational History   Not on file  Tobacco Use   Smoking status: Never   Smokeless tobacco: Never  Vaping Use   Vaping Use: Never used  Substance and Sexual Activity   Alcohol use: Not Currently   Drug use: Not Currently    Types: Marijuana   Sexual activity: Yes  Other Topics Concern   Not on file  Social History Narrative   Not on file   Social Determinants of Health   Financial Resource Strain: Not on file  Food Insecurity: Not on file  Transportation Needs: Not on file  Physical Activity: Not on file  Stress: Not on file  Social Connections: Not on file  Intimate Partner Violence: Not on file    No current outpatient medications on file.  Allergies  Allergen Reactions   Shrimp (Diagnostic)      Review of Systems  All other systems reviewed and are negative.  Objective  Vitals:   01/27/22 0950  BP: 122/80  Pulse: 67  Resp: 16  Temp: 97.8 F (36.6 C)  SpO2: 98%  Weight: 257 lb 14.4 oz (117 kg)  Height: 5\' 9"  (1.753 m)    Body mass index is 38.09 kg/m.  Physical Exam Constitutional:      Appearance: Normal appearance.  HENT:     Head: Normocephalic and atraumatic.     Mouth/Throat:     Mouth: Mucous membranes are moist.     Pharynx: Oropharynx is clear.  Eyes:     Extraocular Movements: Extraocular movements intact.     Conjunctiva/sclera: Conjunctivae  normal.     Pupils: Pupils are equal, round, and reactive to light.  Neck:     Comments: No thyromegaly Cardiovascular:     Rate and Rhythm: Normal rate and regular rhythm.  Pulmonary:     Effort: Pulmonary effort is normal.     Breath sounds: Normal breath sounds.  Musculoskeletal:  Right lower leg: No edema.     Left lower leg: No edema.  Lymphadenopathy:     Cervical: No cervical adenopathy.  Skin:    General: Skin is warm and dry.  Neurological:     General: No focal deficit present.     Mental Status: He is alert. Mental status is at baseline.  Psychiatric:        Mood and Affect: Mood normal.        Behavior: Behavior normal.     No results found for this or any previous visit (from the past 2160 hour(s)).   Fall Risk:    01/27/2022    9:53 AM  Fall Risk   Falls in the past year? 0  Number falls in past yr: 0  Injury with Fall? 0     Functional Status Survey: Is the patient deaf or have difficulty hearing?: No Does the patient have difficulty seeing, even when wearing glasses/contacts?: Yes Does the patient have difficulty concentrating, remembering, or making decisions?: No Does the patient have difficulty walking or climbing stairs?: No Does the patient have difficulty dressing or bathing?: No Does the patient have difficulty doing errands alone such as visiting a doctor's office or shopping?: No    Assessment & Plan  1. Annual physical exam/Lipid screening/Elevated glucose/Encounter for hepatitis C screening test for low risk patient/Screening for HIV without presence of risk factors: Annual screening labs due.  - Hepatitis C Antibody - HIV antibody (with reflex) - CBC w/Diff/Platelet - COMPLETE METABOLIC PANEL WITH GFR - Lipid Profile - HgB A1c - PSA  2. Screening for colon cancer: Referral placed for colonoscopy.  - Ambulatory referral to Gastroenterology  3. Need for Tdap vaccination: Tdap vaccine administered today.  - Tdap vaccine  greater than or equal to 7yo IM   -Prostate cancer screening and PSA options (with potential risks and benefits of testing vs not testing) were discussed along with recent recs/guidelines. -USPSTF grade A and B recommendations reviewed with patient; age-appropriate recommendations, preventive care, screening tests, etc discussed and encouraged; healthy living encouraged; see AVS for patient education given to patient -Discussed importance of 150 minutes of physical activity weekly, eat two servings of fish weekly, eat one serving of tree nuts ( cashews, pistachios, pecans, almonds.Marland Kitchen) every other day, eat 6 servings of fruit/vegetables daily and drink plenty of water and avoid sweet beverages.  -Reviewed Health Maintenance: yes

## 2022-01-28 LAB — PSA: PSA: 0.71 ng/mL (ref <=4.00)

## 2022-01-28 LAB — CBC WITH DIFFERENTIAL/PLATELET
Basophils Absolute: 73 cells/uL (ref 0–200)
Basophils Relative: 1.1 %
MPV: 10.3 fL (ref 7.5–12.5)
Platelets: 190 10*3/uL (ref 140–400)

## 2022-01-28 LAB — COMPLETE METABOLIC PANEL WITH GFR
AG Ratio: 1.2 (calc) (ref 1.0–2.5)
ALT: 15 U/L (ref 9–46)
AST: 17 U/L (ref 10–35)
Albumin: 4.1 g/dL (ref 3.6–5.1)
Alkaline phosphatase (APISO): 67 U/L (ref 35–144)
BUN: 15 mg/dL (ref 7–25)
CO2: 28 mmol/L (ref 20–32)
Calcium: 9 mg/dL (ref 8.6–10.3)
Chloride: 106 mmol/L (ref 98–110)
Creat: 0.99 mg/dL (ref 0.70–1.30)
Globulin: 3.4 g/dL (calc) (ref 1.9–3.7)
Glucose, Bld: 109 mg/dL — ABNORMAL HIGH (ref 65–99)
Potassium: 4.5 mmol/L (ref 3.5–5.3)
Sodium: 142 mmol/L (ref 135–146)
Total Bilirubin: 0.3 mg/dL (ref 0.2–1.2)
Total Protein: 7.5 g/dL (ref 6.1–8.1)
eGFR: 92 mL/min/{1.73_m2} (ref >=60)

## 2022-01-28 LAB — HEMOGLOBIN A1C
Hgb A1c MFr Bld: 6.6 % of total Hgb — ABNORMAL HIGH (ref <5.7)
Mean Plasma Glucose: 143 mg/dL
eAG (mmol/L): 7.9 mmol/L

## 2022-01-28 LAB — LIPID PANEL
Cholesterol: 214 mg/dL — ABNORMAL HIGH (ref <200)
HDL: 38 mg/dL — ABNORMAL LOW (ref >=40)
LDL Cholesterol (Calc): 156 mg/dL (calc) — ABNORMAL HIGH
Non-HDL Cholesterol (Calc): 176 mg/dL (calc) — ABNORMAL HIGH (ref <130)
Total CHOL/HDL Ratio: 5.6 (calc) — ABNORMAL HIGH (ref <5.0)
Triglycerides: 96 mg/dL (ref <150)

## 2022-01-28 LAB — HEPATITIS C ANTIBODY: Hepatitis C Ab: NONREACTIVE

## 2022-01-28 LAB — HIV ANTIBODY (ROUTINE TESTING W REFLEX): HIV 1&2 Ab, 4th Generation: NONREACTIVE

## 2022-01-30 ENCOUNTER — Telehealth: Payer: Self-pay

## 2022-01-30 ENCOUNTER — Other Ambulatory Visit: Payer: Self-pay

## 2022-01-30 DIAGNOSIS — Z1211 Encounter for screening for malignant neoplasm of colon: Secondary | ICD-10-CM

## 2022-01-30 MED ORDER — NA SULFATE-K SULFATE-MG SULF 17.5-3.13-1.6 GM/177ML PO SOLN: 1.0000 | Freq: Once | ORAL | 0 refills | Status: AC

## 2022-01-30 NOTE — Telephone Encounter (Signed)
Gastroenterology Pre-Procedure Review  Request Date: 03/09/22 Requesting Physician: Dr. Allegra Lai  PATIENT REVIEW QUESTIONS: The patient responded to the following health history questions as indicated:    1. Are you having any GI issues? no 2. Do you have a personal history of Polyps? no 3. Do you have a family history of Colon Cancer or Polyps? no 4. Diabetes Mellitus? no 5. Joint replacements in the past 12 months?no 6. Major health problems in the past 3 months?no 7. Any artificial heart valves, MVP, or defibrillator?no    MEDICATIONS & ALLERGIES:    Patient reports the following regarding taking any anticoagulation/antiplatelet therapy:   Plavix, Coumadin, Eliquis, Xarelto, Lovenox, Pradaxa, Brilinta, or Effient? no Aspirin? no  Patient confirms/reports the following medications:  No current outpatient medications on file.   No current facility-administered medications for this visit.    Patient confirms/reports the following allergies:  Allergies  Allergen Reactions   Shrimp (Diagnostic)     No orders of the defined types were placed in this encounter.   AUTHORIZATION INFORMATION Primary Insurance: 1D#: Group #:  Secondary Insurance: 1D#: Group #:  SCHEDULE INFORMATION: Date: 03/09/22 Time: Location: [facility]

## 2022-02-02 ENCOUNTER — Ambulatory Visit (INDEPENDENT_AMBULATORY_CARE_PROVIDER_SITE_OTHER): Payer: 59 | Admitting: Internal Medicine

## 2022-02-02 ENCOUNTER — Encounter: Payer: Self-pay | Admitting: Internal Medicine

## 2022-02-02 VITALS — BP 118/64 | HR 71 | Temp 98.4°F | Resp 18 | Ht 69.0 in | Wt 256.1 lb

## 2022-02-02 DIAGNOSIS — E1165 Type 2 diabetes mellitus with hyperglycemia: Secondary | ICD-10-CM | POA: Diagnosis not present

## 2022-02-02 DIAGNOSIS — M25561 Pain in right knee: Secondary | ICD-10-CM | POA: Diagnosis not present

## 2022-02-02 DIAGNOSIS — G8929 Other chronic pain: Secondary | ICD-10-CM | POA: Diagnosis not present

## 2022-02-02 DIAGNOSIS — M25562 Pain in left knee: Secondary | ICD-10-CM | POA: Diagnosis not present

## 2022-02-02 MED ORDER — NAPROXEN 500 MG PO TABS: 500.0000 mg | ORAL_TABLET | Freq: Every day | ORAL | 0 refills | Status: DC | PRN

## 2022-02-02 NOTE — Progress Notes (Signed)
Acute Office Visit  Subjective:     Patient ID: Edwin Vincent, male    DOB: September 16, 1970, 51 y.o.   MRN: [id number]  Chief Complaint  Patient presents with   Knee Pain    bilateral    HPI Patient is in today for bilateral knee pain.  KNEE PAIN Duration: chronic Involved knee: bilateral, but left worse  Mechanism of injury:  no Location:diffuse Quality:  sharp, dull, and aching Frequency: constant Radiation: no Aggravating factors: weight bearing, walking, stairs, and prolonged sitting  Alleviating factors: nothing  Status: worse Treatments attempted: rest  Relief with NSAIDs?:  No NSAIDs Taken Weakness with weight bearing or walking: no Sensation of giving way: yes Locking: yes Popping: yes Bruising: no Swelling: no Redness: no   Diabetes, Type 2: -New onset -Last A1c 6.6% 11/23 -Medications: Nothing currently  -Denies symptoms of hypoglycemia, polyuria, polydipsia, numbness extremities, foot ulcers/trauma.    Review of Systems  Constitutional:  Negative for chills and fever.  Musculoskeletal:  Positive for joint pain.      Objective:    BP 118/64   Pulse 71   Temp 98.4 F (36.9 C)   Resp 18   Ht 5\' 9"  (1.753 m)   Wt 256 lb 1.6 oz (116.2 kg)   SpO2 96%   BMI 37.82 kg/m  BP Readings from Last 3 Encounters:  02/02/22 118/64  01/27/22 122/80  03/06/18 116/80   Wt Readings from Last 3 Encounters:  02/02/22 256 lb 1.6 oz (116.2 kg)  01/27/22 257 lb 14.4 oz (117 kg)  03/06/18 250 lb (113.4 kg)      Physical Exam Constitutional:      Appearance: Normal appearance.  HENT:     Head: Normocephalic and atraumatic.  Eyes:     Conjunctiva/sclera: Conjunctivae normal.  Cardiovascular:     Rate and Rhythm: Normal rate and regular rhythm.  Pulmonary:     Effort: Pulmonary effort is normal.     Breath sounds: Normal breath sounds.  Musculoskeletal:     Left knee: Bony tenderness and crepitus present. No swelling, deformity or effusion. Normal  range of motion. Tenderness present over the medial joint line. No LCL laxity, MCL laxity, ACL laxity or PCL laxity.    Instability Tests: Anterior drawer test negative. Posterior drawer test negative. Anterior Lachman test negative. Medial McMurray test positive.     Right lower leg: No edema.     Left lower leg: No edema.  Skin:    General: Skin is warm and dry.  Neurological:     General: No focal deficit present.     Mental Status: He is alert. Mental status is at baseline.  Psychiatric:        Mood and Affect: Mood normal.        Behavior: Behavior normal.     No results found for any visits on 02/02/22.      Assessment & Plan:   1. Chronic pain of both knees: Symptoms consistent with bilateral osteoarthritis.  Discussed definitive treatment is total knee replacement, referral to orthopedics placed for imaging as well.  We will treat patient's pain with naproxen 500 mg as needed, discussed potential side effects of this medication.  Also discussed Voltaren gel to use as needed for pain as well.   - naproxen (NAPROSYN) 500 MG tablet; Take 1 tablet (500 mg total) by mouth daily as needed for moderate pain.  Dispense: 30 tablet; Refill: 0 - Ambulatory referral to Orthopedic Surgery  2. Type  2 diabetes mellitus with hyperglycemia, without long-term current use of insulin (HCC): New onset, patient's most recent A1c 6.6.  Discussed starting medications to decrease A1c, patient states he will work on diet and exercise.  Nutritional information for new onset diabetics provided.  Follow-up in 3 months to recheck A1c.  Return in about 3 months (around 05/04/2022).  Margarita Mail, DO

## 2022-02-02 NOTE — Patient Instructions (Addendum)
It was great seeing you today!  Plan discussed at today's visit: -Naproxen 500 mg to take as needed for pain. Do NOT take with any other anti-inflammatories, Advil, Ibuprofen, etc. And take with food.  -Also try Voltaren gel over the counter for pain as well -Referral to Orthopedics placed as well   Follow up in: 3 months   Take care and let us know if you have any questions or concerns prior to your next visit.  Dr. Caralee Ates   Diabetes Mellitus and Nutrition, Adult When you have diabetes, or diabetes mellitus, it is very important to have healthy eating habits because your blood sugar (glucose) levels are greatly affected by what you eat and drink. Eating healthy foods in the right amounts, at about the same times every day, can help you: Manage your blood glucose. Lower your risk of heart disease. Improve your blood pressure. Reach or maintain a healthy weight. What can affect my meal plan? Every person with diabetes is different, and each person has different needs for a meal plan. Your health care provider may recommend that you work with a dietitian to make a meal plan that is best for you. Your meal plan may vary depending on factors such as: The calories you need. The medicines you take. Your weight. Your blood glucose, blood pressure, and cholesterol levels. Your activity level. Other health conditions you have, such as heart or kidney disease. How do carbohydrates affect me? Carbohydrates, also called carbs, affect your blood glucose level more than any other type of food. Eating carbs raises the amount of glucose in your blood. It is important to know how many carbs you can safely have in each meal. This is different for every person. Your dietitian can help you calculate how many carbs you should have at each meal and for each snack. How does alcohol affect me? Alcohol can cause a decrease in blood glucose (hypoglycemia), especially if you use insulin or take certain  diabetes medicines by mouth. Hypoglycemia can be a life-threatening condition. Symptoms of hypoglycemia, such as sleepiness, dizziness, and confusion, are similar to symptoms of having too much alcohol. Do not drink alcohol if: Your health care provider tells you not to drink. You are pregnant, may be pregnant, or are planning to become pregnant. If you drink alcohol: Limit how much you have to: 0-1 drink a day for women. 0-2 drinks a day for men. Know how much alcohol is in your drink. In the [place] one drink equals one 12 oz bottle of beer (355 mL), one 5 oz glass of wine (148 mL), or one 1 oz glass of hard liquor (44 mL). Keep yourself hydrated with water, diet soda, or unsweetened iced tea. Keep in mind that regular soda, juice, and other mixers may contain a lot of sugar and must be counted as carbs. What are tips for following this plan?  Reading food labels Start by checking the serving size on the Nutrition Facts label of packaged foods and drinks. The number of calories and the amount of carbs, fats, and other nutrients listed on the label are based on one serving of the item. Many items contain more than one serving per package. Check the total grams (g) of carbs in one serving. Check the number of grams of saturated fats and trans fats in one serving. Choose foods that have a low amount or none of these fats. Check the number of milligrams (mg) of salt (sodium) in one serving. Most people should limit  total sodium intake to less than 2,300 mg per day. Always check the nutrition information of foods labeled as "low-fat" or "nonfat." These foods may be higher in added sugar or refined carbs and should be avoided. Talk to your dietitian to identify your daily goals for nutrients listed on the label. Shopping Avoid buying canned, pre-made, or processed foods. These foods tend to be high in fat, sodium, and added sugar. Shop around the outside edge of the grocery store. This is where you  will most often find fresh fruits and vegetables, bulk grains, fresh meats, and fresh dairy products. Cooking Use low-heat cooking methods, such as baking, instead of high-heat cooking methods, such as deep frying. Cook using healthy oils, such as olive, canola, or sunflower oil. Avoid cooking with butter, cream, or high-fat meats. Meal planning Eat meals and snacks regularly, preferably at the same times every day. Avoid going long periods of time without eating. Eat foods that are high in fiber, such as fresh fruits, vegetables, beans, and whole grains. Eat 4-6 oz (112-168 g) of lean protein each day, such as lean meat, chicken, fish, eggs, or tofu. One ounce (oz) (28 g) of lean protein is equal to: 1 oz (28 g) of meat, chicken, or fish. 1 egg.  cup (62 g) of tofu. Eat some foods each day that contain healthy fats, such as avocado, nuts, seeds, and fish. What foods should I eat? Fruits Berries. Apples. Oranges. Peaches. Apricots. Plums. Grapes. Mangoes. Papayas. Pomegranates. Kiwi. Cherries. Vegetables Leafy greens, including lettuce, spinach, kale, chard, collard greens, mustard greens, and cabbage. Beets. Cauliflower. Broccoli. Carrots. Green beans. Tomatoes. Peppers. Onions. Cucumbers. Brussels sprouts. Grains Whole grains, such as whole-wheat or whole-grain bread, crackers, tortillas, cereal, and pasta. Unsweetened oatmeal. Quinoa. Brown or wild rice. Meats and other proteins Seafood. Poultry without skin. Lean cuts of poultry and beef. Tofu. Nuts. Seeds. Dairy Low-fat or fat-free dairy products such as milk, yogurt, and cheese. The items listed above may not be a complete list of foods and beverages you can eat and drink. Contact a dietitian for more information. What foods should I avoid? Fruits Fruits canned with syrup. Vegetables Canned vegetables. Frozen vegetables with butter or cream sauce. Grains Refined white flour and flour products such as bread, pasta, snack foods,  and cereals. Avoid all processed foods. Meats and other proteins Fatty cuts of meat. Poultry with skin. Breaded or fried meats. Processed meat. Avoid saturated fats. Dairy Full-fat yogurt, cheese, or milk. Beverages Sweetened drinks, such as soda or iced tea. The items listed above may not be a complete list of foods and beverages you should avoid. Contact a dietitian for more information. Questions to ask a health care provider Do I need to meet with a certified diabetes care and education specialist? Do I need to meet with a dietitian? What number can I call if I have questions? When are the best times to check my blood glucose? Where to find more information: American Diabetes Association: diabetes.org Academy of Nutrition and Dietetics: eatright[employer] of Diabetes and Digestive and Kidney Diseases: StageSync.si Association of Diabetes Care & Education Specialists: diabeteseducator.org Summary It is important to have healthy eating habits because your blood sugar (glucose) levels are greatly affected by what you eat and drink. It is important to use alcohol carefully. A healthy meal plan will help you manage your blood glucose and lower your risk of heart disease. Your health care provider may recommend that you work with a dietitian to make a  meal plan that is best for you. This information is not intended to replace advice given to you by your health care provider. Make sure you discuss any questions you have with your health care provider. Document Revised: 09/20/2019 Document Reviewed: 09/20/2019 Elsevier Patient Education  2023 [employer].

## 2022-02-09 DIAGNOSIS — M170 Bilateral primary osteoarthritis of knee: Secondary | ICD-10-CM | POA: Diagnosis not present

## 2022-03-01 ENCOUNTER — Other Ambulatory Visit: Payer: Self-pay | Admitting: Internal Medicine

## 2022-03-01 DIAGNOSIS — G8929 Other chronic pain: Secondary | ICD-10-CM

## 2022-03-03 NOTE — Telephone Encounter (Signed)
Requested Prescriptions  Pending Prescriptions Disp Refills   naproxen (NAPROSYN) 500 MG tablet [Pharmacy Med Name: NAPROXEN 500 MG TABLET] 90 tablet 0    Sig: TAKE 1 TABLET (500 MG TOTAL) BY MOUTH DAILY AS NEEDED FOR MODERATE PAIN.     Analgesics:  NSAIDS Failed - 03/01/2022  9:05 AM      Failed - Manual Review: Labs are only required if the patient has taken medication for more than 8 weeks.      Passed - Cr in normal range and within 360 days    Creat  Date Value Ref Range Status  01/27/2022 0.99 0.70 - 1.30 mg/dL Final         Passed - HGB in normal range and within 360 days    Hemoglobin  Date Value Ref Range Status  01/27/2022 14.2 13.2 - 17.1 g/dL Final   HGB  Date Value Ref Range Status  09/18/2013 14.1 13.0 - 18.0 g/dL Final         Passed - PLT in normal range and within 360 days    Platelets  Date Value Ref Range Status  01/27/2022 190 140 - 400 Thousand/uL Final   Platelet  Date Value Ref Range Status  09/18/2013 211 150 - 440 x10 3/mm 3 Final         Passed - HCT in normal range and within 360 days    HCT  Date Value Ref Range Status  01/27/2022 42.5 38.5 - 50.0 % Final  09/18/2013 43.4 40.0 - 52.0 % Final         Passed - eGFR is 30 or above and within 360 days    EGFR (African American)  Date Value Ref Range Status  09/18/2013 >60  Final   GFR calc Af Amer  Date Value Ref Range Status  07/10/2015 >60 >60 mL/min Final    Comment:    (NOTE) The eGFR has been calculated using the CKD EPI equation. This calculation has not been validated in all clinical situations. eGFR's persistently <60 mL/min signify possible Chronic Kidney Disease.    EGFR (Non-African Amer.)  Date Value Ref Range Status  09/18/2013 >60  Final    Comment:    eGFR values <86m/min/1.73 m2 may be an indication of chronic kidney disease (CKD). Calculated eGFR is useful in patients with stable renal function. The eGFR calculation will not be reliable in acutely ill  patients when serum creatinine is changing rapidly. It is not useful in  patients on dialysis. The eGFR calculation may not be applicable to patients at the low and high extremes of body sizes, pregnant women, and vegetarians.    GFR calc non Af Amer  Date Value Ref Range Status  07/10/2015 >60 >60 mL/min Final   eGFR  Date Value Ref Range Status  01/27/2022 92 > OR = 60 mL/min/1.71mFinal         Passed - Patient is not pregnant      Passed - Valid encounter within last 12 months    Recent Outpatient Visits           4 weeks ago [facility] pain of both knees   [facility]   1 [facility]       Future App[facility]ments             [facility]  11 months Teodora Medici, [facility]

## 2022-03-09 ENCOUNTER — Ambulatory Visit
Admission: RE | Admit: 2022-03-09 | Discharge: 2022-03-09 | Disposition: A | Discharge status: Home / Self Care | Payer: 59 | Attending: Gastroenterology | Admitting: Gastroenterology

## 2022-03-09 ENCOUNTER — Encounter
Admission: RE | Disposition: A | Discharge status: Home / Self Care | Payer: Self-pay | Source: Home / Self Care | Attending: Gastroenterology

## 2022-03-09 ENCOUNTER — Ambulatory Visit: Payer: 59 | Admitting: Certified Registered"

## 2022-03-09 DIAGNOSIS — K558 Other vascular disorders of intestine: Secondary | ICD-10-CM | POA: Insufficient documentation

## 2022-03-09 DIAGNOSIS — K635 Polyp of colon: Secondary | ICD-10-CM

## 2022-03-09 DIAGNOSIS — D126 Benign neoplasm of colon, unspecified: Secondary | ICD-10-CM | POA: Diagnosis not present

## 2022-03-09 DIAGNOSIS — Z6836 Body mass index (BMI) 36.0-36.9, adult: Secondary | ICD-10-CM | POA: Diagnosis not present

## 2022-03-09 DIAGNOSIS — K639 Disease of intestine, unspecified: Secondary | ICD-10-CM

## 2022-03-09 DIAGNOSIS — K5730 Diverticulosis of large intestine without perforation or abscess without bleeding: Secondary | ICD-10-CM | POA: Diagnosis not present

## 2022-03-09 DIAGNOSIS — Z1211 Encounter for screening for malignant neoplasm of colon: Secondary | ICD-10-CM | POA: Diagnosis not present

## 2022-03-09 DIAGNOSIS — E669 Obesity, unspecified: Secondary | ICD-10-CM | POA: Diagnosis not present

## 2022-03-09 DIAGNOSIS — K599 Functional intestinal disorder, unspecified: Secondary | ICD-10-CM | POA: Diagnosis not present

## 2022-03-09 HISTORY — PX: COLONOSCOPY WITH PROPOFOL: SHX5780

## 2022-03-09 SURGERY — COLONOSCOPY WITH PROPOFOL
Anesthesia: General

## 2022-03-09 MED ORDER — SODIUM CHLORIDE 0.9 % IV SOLN
INTRAVENOUS | Status: DC
  Administered 2022-03-09: 20 mL/h via INTRAVENOUS

## 2022-03-09 MED ORDER — STERILE WATER FOR IRRIGATION IR SOLN
Status: DC | PRN
  Administered 2022-03-09: 60 mL

## 2022-03-09 MED ORDER — PROPOFOL 10 MG/ML IV BOLUS
INTRAVENOUS | Status: AC
  Filled 2022-03-09: qty 20

## 2022-03-09 MED ORDER — LIDOCAINE HCL (CARDIAC) PF 100 MG/5ML IV SOSY
PREFILLED_SYRINGE | INTRAVENOUS | Status: DC | PRN
  Administered 2022-03-09: 100 mg via INTRAVENOUS

## 2022-03-09 MED ORDER — PROPOFOL 10 MG/ML IV BOLUS
INTRAVENOUS | Status: DC | PRN
  Administered 2022-03-09: 80 mg via INTRAVENOUS

## 2022-03-09 MED ORDER — PROPOFOL 500 MG/50ML IV EMUL
INTRAVENOUS | Status: DC | PRN
  Administered 2022-03-09: 150 ug/kg/min via INTRAVENOUS

## 2022-03-09 NOTE — Anesthesia Procedure Notes (Signed)
Procedure Name: LMA Insertion Date/Time: 03/09/2022 11:04 AM  Performed by: Biagio Borg, CRNAPre-anesthesia Checklist: Patient identified, Emergency Drugs available, Suction available and Patient being monitored Patient Re-evaluated:Patient Re-evaluated prior to induction Oxygen Delivery Method: Circle system utilized Preoxygenation: Pre-oxygenation with 100% oxygen Induction Type: IV induction Ventilation: Mask ventilation without difficulty LMA: LMA inserted LMA Size: 4.0 Tube type: Oral Number of attempts: 1 Placement Confirmation: positive ETCO2 and breath sounds checked- equal and bilateral Tube secured with: Tape Dental Injury: Teeth and Oropharynx as per pre-operative assessment

## 2022-03-09 NOTE — Op Note (Signed)
[facility] Gastroenterology Patient Name: Edwin Vincent Procedure Date: 03/09/2022 10:54 AM MRN: [id number] Account #: [id number] Date of Birth: 10-02-1970 Admit Type: Outpatient Age: 52 Room: [facility] ENDO ROOM 4 Gender: Male Note Status: Finalized Instrument Name: Nelda Marseille [id number] Procedure:             Colonoscopy Indications:           Screening for colorectal malignant neoplasm, This is                         the patient's first colonoscopy Providers:             Toney Reil MD, MD Referring MD:          Margarita Mail Medicines:             General Anesthesia Complications:         No immediate complications. Estimated blood loss: None. Procedure:             Pre-Anesthesia Assessment:                        - Prior to the procedure, a History and Physical was                         performed, and patient medications and allergies were                         reviewed. The patient is competent. The risks and                         benefits of the procedure and the sedation options and                         risks were discussed with the patient. All questions                         were answered and informed consent was obtained.                         Patient identification and proposed procedure were                         verified by the physician, the nurse, the                         anesthesiologist, the anesthetist and the technician                         in the pre-procedure area in the procedure room in the                         endoscopy suite. Mental Status Examination: alert and                         oriented. Airway Examination: normal oropharyngeal                         airway and neck mobility. Respiratory Examination:  clear to auscultation. CV Examination: normal.                         Prophylactic Antibiotics: The patient does not require                         prophylactic antibiotics.  Prior Anticoagulants: The                         patient has taken no anticoagulant or antiplatelet                         agents. ASA Grade Assessment: II - A patient with mild                         systemic disease. After reviewing the risks and                         benefits, the patient was deemed in satisfactory                         condition to undergo the procedure. The anesthesia                         plan was to use general anesthesia. Immediately prior                         to administration of medications, the patient was                         re-assessed for adequacy to receive sedatives. The                         heart rate, respiratory rate, oxygen saturations,                         blood pressure, adequacy of pulmonary ventilation, and                         response to care were monitored throughout the                         procedure. The physical status of the patient was                         re-assessed after the procedure.                        After obtaining informed consent, the colonoscope was                         passed under direct vision. Throughout the procedure,                         the patient's blood pressure, pulse, and oxygen                         saturations were monitored continuously. The  Colonoscope was introduced through the anus and                         advanced to the the cecum, identified by appendiceal                         orifice and ileocecal valve. The colonoscopy was                         performed without difficulty. The patient tolerated                         the procedure well. The quality of the bowel                         preparation was evaluated using the BBPS [facility] Bowel                         Preparation Scale) with scores of: Right Colon = 3,                         Transverse Colon = 3 and Left Colon = 3 (entire mucosa                         seen well with no  residual staining, small fragments                         of stool or opaque liquid). The total BBPS score                         equals 9. The ileocecal valve, appendiceal orifice,                         and rectum were photographed. Findings:      The perianal and digital rectal examinations were normal. Pertinent       negatives include normal sphincter tone and no palpable rectal lesions.      A 6 mm polyp was found in the transverse colon. The polyp was sessile.       The polyp was removed with a cold snare. Resection and retrieval were       complete.      A patchy area of mildly erythematous mucosa was found in the       recto-sigmoid colon and in the sigmoid colon. Biopsies were taken with a       cold forceps for histology.      The retroflexed view of the distal rectum and anal verge was normal and       showed no anal or rectal abnormalities.      Multiple diverticula were found in the recto-sigmoid colon and sigmoid       colon. Impression:            - One 6 mm polyp in the transverse colon, removed with                         a cold snare. Resected and retrieved.                        -  Erythematous mucosa in the recto-sigmoid colon and                         in the sigmoid colon. Biopsied.                        - The distal rectum and anal verge are normal on                         retroflexion view.                        - Diverticulosis in the recto-sigmoid colon and in the                         sigmoid colon. Recommendation:        - Discharge patient to home (with escort).                        - Resume previous diet today.                        - Continue present medications.                        - Await pathology results.                        - Repeat colonoscopy in 5-10 years for surveillance                         based on pathology results. Procedure Code(s):     --- Professional ---                        [id number], Colonoscopy, flexible; with  removal of                         tumor(s), polyp(s), or other lesion(s) by snare                         technique                        45380, 59, Colonoscopy, flexible; with biopsy, single                         or multiple Diagnosis Code(s):     --- Professional ---                        Z12.11, Encounter for screening for malignant neoplasm                         of colon                        D12.3, Benign neoplasm of transverse colon (hepatic                         flexure or splenic flexure)  K63.89, Other specified diseases of intestine                        K57.30, Diverticulosis of large intestine without                         perforation or abscess without bleeding CPT copyright 2022 American Medical Association. All rights reserved. The codes documented in this report are preliminary and upon coder review may  be revised to meet current compliance requirements. Dr. Libby Maw Toney Reil MD, MD 03/09/2022 11:30:30 AM This report has been signed electronically. Number of Addenda: 0 Note Initiated On: 03/09/2022 10:54 AM Scope Withdrawal Time: 0 hours 16 minutes 11 seconds  Total Procedure Duration: 0 hours 18 minutes 26 seconds  Estimated Blood Loss:  Estimated blood loss: none.      [facility]

## 2022-03-09 NOTE — H&P (Addendum)
  Edwin Darby, MD [address]  [place]  [place], [place] [address]  Main: [phone]  Fax: [phone] Pager: [phone]  Primary Care Physician:  Teodora Medici, DO Primary Gastroenterologist:  Dr. Cephas Vincent  Pre-Procedure History & Physical: HPI:  Edwin Vincent is a 52 y.o. male is here for a colonoscopy.   No past medical history on file.  Past Surgical History:  Procedure Laterality Date   KNEE SURGERY Bilateral     Prior to Admission medications   Medication Sig Start Date End Date Taking? Authorizing Provider  naproxen (NAPROSYN) 500 MG tablet TAKE 1 TABLET (500 MG TOTAL) BY MOUTH DAILY AS NEEDED FOR MODERATE PAIN. 03/03/22  Yes Teodora Medici, DO    Allergies as of 01/30/2022 - Review Complete 01/30/2022  Allergen Reaction Noted   Shrimp (diagnostic)  01/27/2022    Family History  Problem Relation Age of Onset   Heart attack Father     Social History   Socioeconomic History   Marital status: Married    Spouse name: Not on file   Number of children: Not on file   Years of education: Not on file   Highest education level: Not on file  Occupational History   Not on file  Tobacco Use   Smoking status: Never   Smokeless tobacco: Never  Vaping Use   Vaping Use: Never used  Substance and Sexual Activity   Alcohol use: Not Currently   Drug use: Not Currently    Types: Marijuana   Sexual activity: Yes  Other Topics Concern   Not on file  Social History Narrative   Not on file   Social Determinants of Health   Financial Resource Strain: Not on file  Food Insecurity: Not on file  Transportation Needs: Not on file  Physical Activity: Not on file  Stress: Not on file  Social Connections: Not on file  Intimate Partner Violence: Not on file    Review of Systems: See HPI, otherwise negative ROS  Physical Exam: BP 124/79   Pulse 62   Temp (!) 96.5 F (35.8 C) (Temporal)   Resp 20   Ht 5\' 9"  (1.753 m)   Wt 112.6 kg    SpO2 99%   BMI 36.65 kg/m  General:   Alert,  pleasant and cooperative in NAD Head:  Normocephalic and atraumatic. Neck:  Supple; no masses or thyromegaly. Lungs:  Clear throughout to auscultation.    Heart:  Regular rate and rhythm. Abdomen:  Soft, nontender and nondistended. Normal bowel sounds, without guarding, and without rebound.   Neurologic:  Alert and  oriented x4;  grossly normal neurologically.  Impression/Plan: Edwin Vincent is here for an colonoscopy to be performed for colon cancer screening  Risks, benefits, limitations, and alternatives regarding  endoscopy and colonoscopy have been reviewed with the patient.  Questions have been answered.  All parties agreeable.   Sherri Sear, MD  03/09/2022, 10:59 AM

## 2022-03-09 NOTE — Transfer of Care (Signed)
Immediate Anesthesia Transfer of Care Note  Patient: Edwin Vincent  Procedure(s) Performed: COLONOSCOPY WITH PROPOFOL  Patient Location: PACU and Endoscopy Unit  Anesthesia Type:General  Level of Consciousness: sedated  Airway & Oxygen Therapy: Patient Spontanous Breathing  Post-op Assessment: Report given to RN and Post -op Vital signs reviewed and stable  Post vital signs: Reviewed and stable  Last Vitals:  Vitals Value Taken Time  BP    Temp    Pulse    Resp    SpO2      Last Pain:  Vitals:   03/09/22 1007  TempSrc: Temporal  PainSc: 0-No pain         Complications: No notable events documented.

## 2022-03-09 NOTE — Anesthesia Preprocedure Evaluation (Signed)
Anesthesia Evaluation  Patient identified by MRN, date of birth, ID band Patient awake    Reviewed: Allergy & Precautions, NPO status , Patient's Chart, lab work & pertinent test results  History of Anesthesia Complications Negative for: history of anesthetic complications  Airway Mallampati: III  TM Distance: >3 FB Neck ROM: full    Dental  (+) Dental Advidsory Given   Pulmonary neg pulmonary ROS, neg COPD   Pulmonary exam normal        Cardiovascular (-) hypertension(-) Past MI negative cardio ROS Normal cardiovascular exam     Neuro/Psych negative neurological ROS  negative psych ROS   GI/Hepatic negative GI ROS, Neg liver ROS,,,  Endo/Other  negative endocrine ROS    Renal/GU negative Renal ROS  negative genitourinary   Musculoskeletal   Abdominal   Peds  Hematology negative hematology ROS (+)   Anesthesia Other Findings No past medical history on file.  Past Surgical History: No date: KNEE SURGERY; Bilateral  BMI    Body Mass Index: 36.65 kg/m      Reproductive/Obstetrics negative OB ROS                             Anesthesia Physical Anesthesia Plan  ASA: 2  Anesthesia Plan: General   Post-op Pain Management: Minimal or no pain anticipated   Induction: Intravenous  PONV Risk Score and Plan: 3 and Propofol infusion, TIVA and Ondansetron  Airway Management Planned: Nasal Cannula  Additional Equipment: None  Intra-op Plan:   Post-operative Plan:   Informed Consent: I have reviewed the patients History and Physical, chart, labs and discussed the procedure including the risks, benefits and alternatives for the proposed anesthesia with the patient or authorized representative who has indicated his/her understanding and acceptance.     Dental advisory given  Plan Discussed with: CRNA and Surgeon  Anesthesia Plan Comments: (Discussed risks of anesthesia with  patient, including possibility of difficulty with spontaneous ventilation under anesthesia necessitating airway intervention, PONV, and rare risks such as cardiac or respiratory or neurological events, and allergic reactions. Discussed the role of CRNA in patient's perioperative care. Patient understands.)       Anesthesia Quick Evaluation

## 2022-03-09 NOTE — Anesthesia Postprocedure Evaluation (Signed)
Anesthesia Post Note  Patient: Edwin Vincent  Procedure(s) Performed: COLONOSCOPY WITH PROPOFOL  Patient location during evaluation: Endoscopy Anesthesia Type: General Level of consciousness: awake and alert Pain management: pain level controlled Vital Signs Assessment: post-procedure vital signs reviewed and stable Respiratory status: spontaneous breathing, nonlabored ventilation, respiratory function stable and patient connected to nasal cannula oxygen Cardiovascular status: blood pressure returned to baseline and stable Postop Assessment: no apparent nausea or vomiting Anesthetic complications: no  No notable events documented.   Last Vitals:  Vitals:   03/09/22 1150 03/09/22 1200  BP: 111/74 106/68  Pulse: 63 (!) 51  Resp: 17 16  Temp:    SpO2: 98% 100%    Last Pain:  Vitals:   03/09/22 1200  TempSrc:   PainSc: 0-No pain                 Dimas Millin

## 2022-03-10 ENCOUNTER — Encounter: Payer: Self-pay | Admitting: Gastroenterology

## 2022-03-12 LAB — SURGICAL PATHOLOGY

## 2022-03-20 ENCOUNTER — Telehealth: Payer: Self-pay

## 2022-03-20 NOTE — Telephone Encounter (Signed)
-----  Message from Lin Landsman, MD sent at 03/20/2022 11:30 AM EST ----- Caryl Pina  Please schedule a follow-up appointment to discuss pathology results from recent colonoscopy Would like to see him next week if possible  RV

## 2022-03-20 NOTE — Telephone Encounter (Signed)
Called and patient wanted to come on Monday made appointment for 1:15

## 2022-03-23 ENCOUNTER — Encounter: Payer: Self-pay | Admitting: Gastroenterology

## 2022-03-23 ENCOUNTER — Ambulatory Visit (INDEPENDENT_AMBULATORY_CARE_PROVIDER_SITE_OTHER): Payer: 59 | Admitting: Gastroenterology

## 2022-03-23 VITALS — BP 124/86 | HR 59 | Temp 98.3°F | Ht 69.0 in | Wt 247.4 lb

## 2022-03-23 DIAGNOSIS — K639 Disease of intestine, unspecified: Secondary | ICD-10-CM

## 2022-03-23 NOTE — Progress Notes (Signed)
Edwin Darby, MD [address]  [place]  [place], [place] [address]  Main: [phone]  Fax: [phone] Pager: [phone]   Primary Care Physician: Teodora Medici, DO  Primary Gastroenterologist:  Dr. Cephas Vincent  Chief Complaint  Patient presents with   discuss path results     HPI: Edwin Vincent is a 52 y.o. male who recently underwent screening colonoscopy was incidentally found to have mild patchy sigmoid erythema.  Targeted biopsies were performed from the erythematous areas, pathology revealed incidental submucosal small arteries with mural thickening and near occlusion with no evidence of vasculitis.  B.  COLON, SIGMOID; COLD BIOPSY:  - COLONIC MUCOSA WITH FOCAL SMALL LYMPHOID AGGREGATES, HYPERPLASTIC  EPITHELIAL CHANGE, AND SMALL SCATTERED AREAS OF STROMAL HEMORRHAGE.  - INCIDENTAL SUBMUCOSAL SMALL ARTERIES WITH MURAL THICKENING AND NEAR  OCCLUSION, SEE COMMENT.  - NEGATIVE FOR VASCULITIS, DYSPLASIA, AND MALIGNANCY.  Overall, the above findings are not specific and their significance is  unclear. The differential diagnosis includes and is not limited to  amyloidosis and vascular obliterative disease (e.g. thromboangiitis  obliterans).    When interviewed the patient today, he denied any sort of GI symptoms, not a smoker, denies any joint pains, arthralgias, myalgias, leg pain, headache, vision changes, he does not have any renal insufficiency.  He does have diabetes, HbA1c was 6.6.  No current outpatient medications on file.   No current facility-administered medications for this visit.    Allergies as of 03/23/2022 - Review Complete 03/09/2022  Allergen Reaction Noted   Shrimp (diagnostic)  01/27/2022    NSAIDs: None  Antiplts/Anticoagulants/Anti thrombotics: None   ROS:  General: Negative for anorexia, weight loss, fever, chills, fatigue, weakness. ENT: Negative for hoarseness, difficulty swallowing , nasal congestion. CV:  Negative for chest pain, angina, palpitations, dyspnea on exertion, peripheral edema.  Respiratory: Negative for dyspnea at rest, dyspnea on exertion, cough, sputum, wheezing.  GI: See history of present illness. GU:  Negative for dysuria, hematuria, urinary incontinence, urinary frequency, nocturnal urination.  Endo: Negative for unusual weight change.    Physical Examination:   BP (!) 136/90 (BP Location: Left Arm, Patient Position: Sitting, Cuff Size: Normal)   Pulse 61   Temp 98.3 F (36.8 C) (Oral)   Ht 5\' 9"  (1.753 m)   Wt 247 lb 6 oz (112.2 kg)   BMI 36.53 kg/m   General: Well-nourished, well-developed in no acute distress.  Eyes: No icterus. Conjunctivae pink. Mouth: Oropharyngeal mucosa moist and pink , no lesions erythema or exudate. Lungs: Clear to auscultation bilaterally. Non-labored. Heart: Regular rate and rhythm, no murmurs rubs or gallops.  Abdomen: Bowel sounds are normal, nontender, nondistended, no hepatosplenomegaly or masses, no hernia , no rebound or guarding.   Extremities: No lower extremity edema. No clubbing or deformities. Neuro: Alert and oriented x 3.  Grossly intact. Skin: Warm and dry, no jaundice.   Psych: Alert and cooperative, normal mood and affect.   Imaging Studies: No results found.  Assessment and Plan:   Edwin Vincent is a 52 y.o. male with obesity, diabetes he is seen in consultation to discuss about pathology results.  Patient underwent screening colonoscopy and was incidentally found to have mild patchy erythematous spots in the sigmoid colon.  Pathology revealed incidental submucosal arteries with mural thickening and near occlusion, and scattered areas of stromal hemorrhage.  No evidence of vasculitis.  Differentials include amyloidosis or thromboangiitis obliterans.  The biopsies are scant and limited.  Patient, however does not  manifest any clinical symptoms of thromboangiitis obliterans.  Discussed with patient regarding watchful  waiting and undergo further investigation if he develops symptoms versus repeat sigmoidoscopy with more biopsies.  He preferred expectant management at this time.  Advised patient not to pick up smoking tobacco in his life Patient expressed understanding of my recommendations  Follow up as needed   Dr Sherri Sear, MD

## 2022-04-13 DIAGNOSIS — R319 Hematuria, unspecified: Secondary | ICD-10-CM | POA: Diagnosis not present

## 2022-05-03 NOTE — Progress Notes (Deleted)
   Established Office Visit  Subjective:     Patient ID: Edwin Vincent, male    DOB: 1971/02/03, 52 y.o.   MRN: [id number]  No chief complaint on file.   HPI Patient is in today for follow up.   Diabetes, Type 2: -New onset -Last A1c 6.6% 11/23 -Medications: Nothing currently  -Eye Exam: -Foot Exam: -Microalbumin:  -Denies symptoms of hypoglycemia, polyuria, polydipsia, numbness extremities, foot ulcers/trauma.    Review of Systems  Constitutional:  Negative for chills and fever.  Musculoskeletal:  Positive for joint pain.      Objective:    There were no vitals taken for this visit. BP Readings from Last 3 Encounters:  03/23/22 124/86  03/09/22 106/68  02/02/22 118/64   Wt Readings from Last 3 Encounters:  03/23/22 247 lb 6 oz (112.2 kg)  03/09/22 248 lb 3.2 oz (112.6 kg)  02/02/22 256 lb 1.6 oz (116.2 kg)      Physical Exam Constitutional:      Appearance: Normal appearance.  HENT:     Head: Normocephalic and atraumatic.  Eyes:     Conjunctiva/sclera: Conjunctivae normal.  Cardiovascular:     Rate and Rhythm: Normal rate and regular rhythm.  Pulmonary:     Effort: Pulmonary effort is normal.     Breath sounds: Normal breath sounds.  Musculoskeletal:     Left knee: Bony tenderness and crepitus present. No swelling, deformity or effusion. Normal range of motion. Tenderness present over the medial joint line. No LCL laxity, MCL laxity, ACL laxity or PCL laxity.    Instability Tests: Anterior drawer test negative. Posterior drawer test negative. Anterior Lachman test negative. Medial McMurray test positive.     Right lower leg: No edema.     Left lower leg: No edema.  Skin:    General: Skin is warm and dry.  Neurological:     General: No focal deficit present.     Mental Status: He is alert. Mental status is at baseline.  Psychiatric:        Mood and Affect: Mood normal.        Behavior: Behavior normal.     No results found for any visits on  05/04/22.      Assessment & Plan:   1. Chronic pain of both knees: Symptoms consistent with bilateral osteoarthritis.  Discussed definitive treatment is total knee replacement, referral to orthopedics placed for imaging as well.  We will treat patient's pain with naproxen 500 mg as needed, discussed potential side effects of this medication.  Also discussed Voltaren gel to use as needed for pain as well.   - naproxen (NAPROSYN) 500 MG tablet; Take 1 tablet (500 mg total) by mouth daily as needed for moderate pain.  Dispense: 30 tablet; Refill: 0 - Ambulatory referral to Orthopedic Surgery  2. Type 2 diabetes mellitus with hyperglycemia, without long-term current use of insulin ([place]): New onset, patient's most recent A1c 6.6.  Discussed starting medications to decrease A1c, patient states he will work on diet and exercise.  Nutritional information for new onset diabetics provided.  Follow-up in 3 months to recheck A1c.  No follow-ups on file.  Teodora Medici, DO

## 2022-05-04 ENCOUNTER — Ambulatory Visit: Payer: 59 | Admitting: Internal Medicine

## 2023-01-31 NOTE — Progress Notes (Unsigned)
Name: Edwin Vincent   MRN: [id number]    DOB: 1970-03-07   Date:01/31/2023       Progress Note  Subjective  Chief Complaint  No chief complaint on file.   HPI  Patient presents for annual CPE ***.  IPSS Questionnaire (AUA-7): Over the past month.   1)  How often have you had a sensation of not emptying your bladder completely after you finish urinating?  {Rating:19227}  2)  How often have you had to urinate again less than two hours after you finished urinating? {Rating:19227}  3)  How often have you found you stopped and started again several times when you urinated?  {Rating:19227}  4) How difficult have you found it to postpone urination?  {Rating:19227}  5) How often have you had a weak urinary stream?  {Rating:19227}  6) How often have you had to push or strain to begin urination?  {Rating:19227}  7) How many times did you most typically get up to urinate from the time you went to bed until the time you got up in the morning?  {Rating:19228}  Total score:  0-7 mildly symptomatic   8-19 moderately symptomatic   20-35 severely symptomatic     Diet: *** Exercise: *** Last Dental Exam: **** Last Eye Exam: ***  Depression: phq 9 is {gen pos [id number]}    02/02/2022    1:02 PM 01/27/2022    9:53 AM  Depression screen PHQ 2/9  Decreased Interest 0 0  Down, Depressed, Hopeless 0 0  PHQ - 2 Score 0 0  Altered sleeping 0 0  Tired, decreased energy 0 0  Change in appetite 0 0  Feeling bad or failure about yourself  0 0  Trouble concentrating 0 0  Moving slowly or fidgety/restless 0 0  Suicidal thoughts 0 0  PHQ-9 Score 0 0  Difficult doing work/chores Not difficult at all Not difficult at all    Hypertension:  BP Readings from Last 3 Encounters:  03/23/22 124/86  03/09/22 106/68  02/02/22 118/64    Obesity: Wt Readings from Last 3 Encounters:  03/23/22 247 lb 6 oz (112.2 kg)  03/09/22 248 lb 3.2 oz (112.6 kg)  02/02/22 256 lb 1.6 oz (116.2 kg)   BMI  Readings from Last 3 Encounters:  03/23/22 36.53 kg/m  03/09/22 36.65 kg/m  02/02/22 37.82 kg/m     Lipids:  Lab Results  Component Value Date   CHOL 214 (H) 01/27/2022   Lab Results  Component Value Date   HDL 38 (L) 01/27/2022   Lab Results  Component Value Date   LDLCALC 156 (H) 01/27/2022   Lab Results  Component Value Date   TRIG 96 01/27/2022   Lab Results  Component Value Date   CHOLHDL 5.6 (H) 01/27/2022   No results found for: "LDLDIRECT" Glucose:  Glucose  Date Value Ref Range Status  09/18/2013 141 (H) 65 - 99 mg/dL Final   Glucose, Bld  Date Value Ref Range Status  01/27/2022 109 (H) 65 - 99 mg/dL Final    Comment:    .            Fasting reference interval . For someone without known diabetes, a glucose value between 100 and 125 mg/dL is consistent with prediabetes and should be confirmed with a follow-up test. .   07/10/2015 115 (H) 65 - 99 mg/dL Final     ***  Married STD testing and prevention (HIV/chl/gon/syphilis):  {yes/no/default n/a:21102::"not applicable"} Sexual history:  Hep  C Screening:  Skin cancer: Discussed monitoring for atypical lesions Colorectal cancer: 03/09/2022 Prostate cancer:  {yes/no/default n/a:21102::"not applicable"} Lab Results  Component Value Date   PSA 0.71 01/27/2022     Lung cancer:  Low Dose CT Chest recommended if Age 89-80 years, 30 pack-year currently smoking OR have quit w/in 15years. Patient  {Response; is/is not/na:63} a candidate for screening   AAA: The USPSTF recommends one-time screening with ultrasonography in men ages 69 to 75 years who have ever smoked. Patient   {Response; is /is not/na:63}, a candidate for screening  ECG:  ***  Vaccines:   HPV:  Tdap: 01/27/2022 Shingrix:  Pneumonia:  Flu:  COVID-19:  Advanced Care Planning: A voluntary discussion about advance care planning including the explanation and discussion of advance directives.  Discussed health care proxy and  Living will, and the patient was able to identify a health care proxy as ***.  Patient {DOES_DOES [id number]} have a living will and power of attorney of health care   Patient Active Problem List   Diagnosis Date Noted   Encounter for screening colonoscopy 03/09/2022   Polyp of transverse colon 03/09/2022   Erythema of colon 03/09/2022    Past Surgical History:  Procedure Laterality Date   COLONOSCOPY WITH PROPOFOL N/A 03/09/2022   Procedure: COLONOSCOPY WITH PROPOFOL;  Surgeon: Toney Reil, MD;  Location[facility] ENDOSCOPY;  Service: Gastroenterology;  Laterality: N/A;   KNEE SURGERY Bilateral     Family History  Problem Relation Age of Onset   Heart attack Father     Social History   Socioeconomic History   Marital status: Married    Spouse name: Not on file   Number of children: Not on file   Years of education: Not on file   Highest education level: Not on file  Occupational History   Not on file  Tobacco Use   Smoking status: Never   Smokeless tobacco: Never  Vaping Use   Vaping status: Never Used  Substance and Sexual Activity   Alcohol use: Not Currently   Drug use: Not Currently    Types: Marijuana   Sexual activity: Yes  Other Topics Concern   Not on file  Social History Narrative   Not on file   Social Determinants of Health   Financial Resource Strain: Not on file  Food Insecurity: Not on file  Transportation Needs: Not on file  Physical Activity: Not on file  Stress: Not on file  Social Connections: Not on file  Intimate Partner Violence: Not on file    No current outpatient medications on file.  Allergies  Allergen Reactions   Shrimp (Diagnostic)      ROS  ***   Objective  There were no vitals filed for this visit.  There is no height or weight on file to calculate BMI.  Physical Exam ***  No results found for this or any previous visit (from the past 2160 hour(s)).   Fall Risk:    02/02/2022    1:02 PM 01/27/2022     9:53 AM  Fall Risk   Falls in the past year? 1 0  Number falls in past yr: 0 0  Injury with Fall? 0 0     Functional Status Survey:      Assessment & Plan  There are no diagnoses linked to this encounter.   -Prostate cancer screening and PSA options (with potential risks and benefits of testing vs not testing) were discussed along with recent recs/guidelines. -USPSTF grade  A and B recommendations reviewed with patient; age-appropriate recommendations, preventive care, screening tests, etc discussed and encouraged; healthy living encouraged; see AVS for patient education given to patient -Discussed importance of 150 minutes of physical activity weekly, eat two servings of fish weekly, eat one serving of tree nuts ( cashews, pistachios, pecans, almonds.Marland Kitchen) every other day, eat 6 servings of fruit/vegetables daily and drink plenty of water and avoid sweet beverages.  -Reviewed Health Maintenance: {yes/no/default n/a:21102::"not applicable"}

## 2023-02-01 ENCOUNTER — Encounter: Payer: 59 | Admitting: Internal Medicine

## 2024-01-19 ENCOUNTER — Telehealth: Payer: Self-pay

## 2024-01-19 DIAGNOSIS — G8929 Other chronic pain: Secondary | ICD-10-CM

## 2024-01-19 NOTE — Progress Notes (Signed)
 Complex Care Management Note  Care Guide Note 01/19/2024 Name: HAMEED KOLAR MRN: [id number] DOB: 03/12/1970  JILL RUPPE is a 53 y.o. year old male who sees Bernardo Fend, DO for primary care. I reached out to Ozell LITTIE Bloodgood by phone today to offer complex care management services.  Mr. Mchugh was given information about Complex Care Management services today including:   The Complex Care Management services include support from the care team which includes your Nurse Care Manager, Clinical Social Worker, or Pharmacist.  The Complex Care Management team is here to help remove barriers to the health concerns and goals most important to you. Complex Care Management services are voluntary, and the patient may decline or stop services at any time by request to their care team member.   Complex Care Management Consent Status: Patient did not agree to participate in complex care management services at this time.  Follow up plan:  Patient states he does not have MM plan and declined services.  Encounter Outcome:  Patient Refused  Dreama Agent [facility], [facility] VBCI Assistant Direct Dial: [phone]  Fax: [phone]
